# Patient Record
Sex: Female | Born: 1990 | Marital: Married | Smoking: Never smoker
Health system: Southern US, Community
[De-identification: ages and names within clinical notes are randomized; demographics above are authoritative.]

## PROBLEM LIST (undated history)

## (undated) DIAGNOSIS — Z8759 Personal history of other complications of pregnancy, childbirth and the puerperium: Secondary | ICD-10-CM

## (undated) DIAGNOSIS — IMO0001 Reserved for inherently not codable concepts without codable children: Secondary | ICD-10-CM

## (undated) DIAGNOSIS — H9190 Unspecified hearing loss, unspecified ear: Secondary | ICD-10-CM

## (undated) DIAGNOSIS — Z5189 Encounter for other specified aftercare: Secondary | ICD-10-CM

## (undated) HISTORY — DX: Encounter for other specified aftercare: Z51.89

## (undated) HISTORY — DX: Unspecified hearing loss, unspecified ear: H91.90

## (undated) HISTORY — PX: OTHER SURGICAL HISTORY: SHX169

## (undated) HISTORY — DX: Personal history of other complications of pregnancy, childbirth and the puerperium: Z87.59

## (undated) HISTORY — DX: Reserved for inherently not codable concepts without codable children: IMO0001

---

## 2016-11-10 DIAGNOSIS — H90A31 Mixed conductive and sensorineural hearing loss, unilateral, right ear with restricted hearing on the contralateral side: Secondary | ICD-10-CM | POA: Insufficient documentation

## 2016-11-10 DIAGNOSIS — H7201 Central perforation of tympanic membrane, right ear: Secondary | ICD-10-CM | POA: Insufficient documentation

## 2019-11-06 ENCOUNTER — Other Ambulatory Visit (HOSPITAL_COMMUNITY)
Admission: RE | Admit: 2019-11-06 | Discharge: 2019-11-06 | Disposition: A | Discharge status: Home / Self Care | Payer: Medicaid Other | Source: Ambulatory Visit | Attending: Advanced Practice Midwife | Admitting: Advanced Practice Midwife

## 2019-11-06 ENCOUNTER — Other Ambulatory Visit: Payer: Self-pay

## 2019-11-06 ENCOUNTER — Encounter: Payer: Self-pay | Admitting: Advanced Practice Midwife

## 2019-11-06 ENCOUNTER — Ambulatory Visit (INDEPENDENT_AMBULATORY_CARE_PROVIDER_SITE_OTHER): Payer: Medicaid Other | Admitting: Advanced Practice Midwife

## 2019-11-06 VITALS — BP 98/52 | HR 80 | Ht 62.0 in | Wt 148.0 lb

## 2019-11-06 DIAGNOSIS — O0990 Supervision of high risk pregnancy, unspecified, unspecified trimester: Secondary | ICD-10-CM

## 2019-11-06 DIAGNOSIS — Z3A23 23 weeks gestation of pregnancy: Secondary | ICD-10-CM | POA: Diagnosis present

## 2019-11-06 DIAGNOSIS — O0930 Supervision of pregnancy with insufficient antenatal care, unspecified trimester: Secondary | ICD-10-CM | POA: Insufficient documentation

## 2019-11-06 DIAGNOSIS — O0932 Supervision of pregnancy with insufficient antenatal care, second trimester: Secondary | ICD-10-CM

## 2019-11-06 DIAGNOSIS — Z789 Other specified health status: Secondary | ICD-10-CM

## 2019-11-06 DIAGNOSIS — Z3480 Encounter for supervision of other normal pregnancy, unspecified trimester: Secondary | ICD-10-CM

## 2019-11-06 NOTE — Patient Instructions (Signed)

## 2019-11-06 NOTE — Progress Notes (Signed)
Interpreter # [phone] Ipad. Armandina Stammer RN   Patient reports that her last period was Sept 2019 and receive a positive pregnancy test in July 2021.  Patient had been on Depo Provera.Patient reports that she has been feeling fetal movements since July 2021. Patient states her back has been hurting. Armandina Stammer RN   DATING AND VIABILITY SONOGRAM   Patricia Bonilla is a 29 y.o. year old G77P2002 with LMP No LMP recorded (lmp unknown). Patient is pregnant. which would correlate to  [redacted]w[redacted]d weeks gestation.  She has irregular menstrual cycles and was previously on Depo Provera and reports her LMP was in Sept 2019  She is here today for a confirmatory initial sonogram.    GESTATION: [place]   FETAL ACTIVITY:          Heart rate    147             The fetus is active.     GESTATIONAL AGE AND  BIOMETRICS:  Gestational criteria: Estimated Date of Delivery: 03/05/20 by todays ultrasound now at 23w  Previous Scans:0           Femur lenth 3.99 cm 22.6 weeks  BPD 7.43 cm 29.0 weeks  HC 21.29 cm 23.2 weeks                                                                   AVERAGE EGA(BY THIS SCAN):  23.0 weeks  WORKING EDD(  ultrasound ):  03/05/2020     TECHNICIAN COMMENTS: Patient scheduled for anatomy ultrasound with MFM on August 26th.  Patient informed that the ultrasound is considered a limited obstetric ultrasound and is not intended to be a complete ultrasound exam. Patient also informed that the ultrasound is not being completed with the intent of assessing for fetal or placental anomalies or any pelvic abnormalities. Explained that the purpose of today's ultrasound is to assess for fetal heart rate. Patient acknowledges the purpose of the exam and the limitations of the study.     Armandina Stammer 11/06/2019 11:32 AM  Patient was assessed and managed by nursing staff during this encounter. I have reviewed the chart and agree with the documentation and plan. I have  also made any necessary editorial changes.  Wynelle Bourgeois, CNM 11/06/2019 12:17 PM

## 2019-11-06 NOTE — Progress Notes (Signed)
PRENATAL VISIT NOTE  Subjective:  Patricia Bonilla is a 29 y.o. G3P2002 at [redacted]w[redacted]d being seen today for her first prenatal visit for this pregnancy.  She is currently monitored for the following issues for this low-risk pregnancy and has Supervision of other normal pregnancy, antepartum; Late prenatal care affecting pregnancy; Language barrier; Mixed conductive and sensorineural hearing loss of right ear with restricted hearing of left ear; and Tympanic membrane central perforation, right on their problem list.  Late to care due to unsure dates.  States had Neg UPT in ED in April   Had been on DepoProvera [place] interpretor used (IPAD)  Patient reports no complaints.  Contractions: Not present. Vag. Bleeding: None.  Movement: Present. Denies leaking of fluid.   She is planning to breastfeed. Desires Paragard IUD for contraception.   The following portions of the patient's history were reviewed and updated as appropriate: allergies, current medications, past family history, past medical history, past social history, past surgical history and problem list.   Objective:   Vitals:   11/06/19 1032 11/06/19 1032  BP: (!) 98/52   Pulse: 80   Weight: 148 lb (67.1 kg)   Height:  5\' 2"  (1.575 m)    Fetal Status: Fetal Heart Rate (bpm): 147 Fundal Height: 23 cm Movement: Present     General:  Alert, oriented and cooperative. Patient is in no acute distress.  Skin: Skin is warm and dry. No rash noted.   Cardiovascular: Normal heart rate and rhythm noted  Respiratory: Normal respiratory effort, no problems with respiration noted. Clear to auscultation.   Abdomen: Soft, gravid, appropriate for gestational age. Normal bowel sounds. Non-tender. Pain/Pressure: Present     Pelvic: Cervical exam performed       Normal cervical contour, no lesions, no bleeding following pap, normal discharge  Extremities: Normal range of motion.  Edema: None  Mental Status: Normal mood and affect. Normal behavior. Normal  judgment and thought content.   Assessment and Plan:  Pregnancy: G3P2002 at [redacted]w[redacted]d  Initial labs drawn. Continue prenatal vitamins. Discussed and offered genetic screening options, including Quad screen/AFP, NIPS testing, and option to decline testing. Benefits/risks/alternatives reviewed. Pt aware that anatomy [redacted]w[redacted]d is form of genetic screening with lower accuracy in detecting trisomies than blood work.  Pt chooses genetic screening today. NIPS: ordered. Ultrasound discussed; fetal anatomic survey: ordered. Problem list reviewed and updated. The nature of [place] [facility] with multiple MDs and other Advanced Practice Providers was explained to patient; also emphasized that residents, students are part of our team. Routine obstetric precautions reviewed.  1. [redacted] weeks gestation of pregnancy  - Cytology - PAP( [place]) - AFP TETRA - [facility] MFM OB DETAIL +14 WK; Future - CBC/D/Plt+RPR+Rh+ABO+Rub Ab... - Hemoglobin A1c - Hemoglobpathy+Fer w/A Thal Rfx - Urine Culture  2. Late prenatal care affecting pregnancy, antepartum  - Cytology - PAP( [place]) - AFP TETRA - [place] MFM OB DETAIL +14 WK; Future - CBC/D/Plt+RPR+Rh+ABO+Rub Ab... - Hemoglobin A1c - Hemoglobpathy+Fer w/A Thal Rfx - Urine Culture  3. Supervision of high risk pregnancy, antepartum  - Cytology - PAP( ) - AFP TETRA - [place] MFM OB DETAIL +14 WK; Future - CBC/D/Plt+RPR+Rh+ABO+Rub Ab... - Hemoglobin A1c - Hemoglobpathy+Fer w/A Thal Rfx - Urine Culture  4. Supervision of other normal pregnancy, antepartum   5. Late prenatal care affecting pregnancy in second trimester     Panorama today     Anatomy US this week  6. Language barrier  IPAD interpretor used  Preterm labor symptoms and general obstetric precautions including but not limited to vaginal bleeding, contractions, leaking of fluid and fetal movement were reviewed in detail with the patient. Please refer to After  Visit Summary for other counseling recommendations.   Return in about 4 weeks (around 12/04/2019) for [facility].  Future Appointments  Date Time Provider Department Center  11/08/2019 12:30 PM [facility] NURSE [facility]  11/08/2019 12:45 PM [facility] US6 [facility]  12/04/2019  9:30 AM Aviva Signs, CNM [facility] None    Wynelle Bourgeois, CNM

## 2019-11-07 LAB — CYTOLOGY - PAP
Chlamydia: NEGATIVE
Comment: NEGATIVE
Comment: NEGATIVE
Comment: NORMAL
Diagnosis: NEGATIVE
High risk HPV: NEGATIVE
Neisseria Gonorrhea: NEGATIVE

## 2019-11-08 ENCOUNTER — Ambulatory Visit: Payer: Medicaid Other | Admitting: *Deleted

## 2019-11-08 ENCOUNTER — Other Ambulatory Visit: Payer: Self-pay | Admitting: *Deleted

## 2019-11-08 ENCOUNTER — Other Ambulatory Visit: Payer: Self-pay

## 2019-11-08 ENCOUNTER — Encounter: Payer: Self-pay | Admitting: *Deleted

## 2019-11-08 ENCOUNTER — Ambulatory Visit: Payer: Medicaid Other | Attending: Advanced Practice Midwife

## 2019-11-08 DIAGNOSIS — Z3A23 23 weeks gestation of pregnancy: Secondary | ICD-10-CM | POA: Diagnosis not present

## 2019-11-08 DIAGNOSIS — Z3687 Encounter for antenatal screening for uncertain dates: Secondary | ICD-10-CM | POA: Diagnosis not present

## 2019-11-08 DIAGNOSIS — Z362 Encounter for other antenatal screening follow-up: Secondary | ICD-10-CM

## 2019-11-08 DIAGNOSIS — O0932 Supervision of pregnancy with insufficient antenatal care, second trimester: Secondary | ICD-10-CM | POA: Insufficient documentation

## 2019-11-08 DIAGNOSIS — Z3480 Encounter for supervision of other normal pregnancy, unspecified trimester: Secondary | ICD-10-CM

## 2019-11-08 DIAGNOSIS — O2692 Pregnancy related conditions, unspecified, second trimester: Secondary | ICD-10-CM

## 2019-11-08 DIAGNOSIS — Z789 Other specified health status: Secondary | ICD-10-CM

## 2019-11-08 DIAGNOSIS — O0990 Supervision of high risk pregnancy, unspecified, unspecified trimester: Secondary | ICD-10-CM

## 2019-11-08 DIAGNOSIS — O0930 Supervision of pregnancy with insufficient antenatal care, unspecified trimester: Secondary | ICD-10-CM

## 2019-11-08 DIAGNOSIS — O0992 Supervision of high risk pregnancy, unspecified, second trimester: Secondary | ICD-10-CM | POA: Insufficient documentation

## 2019-11-08 LAB — URINE CULTURE

## 2019-11-13 LAB — AFP TETRA
DIA Value (EIA): 242.74 pg/mL
Gestational Age: 22.6 WEEKS
MSAFP: 141.3 ng/mL
MSHCG: 44421 m[IU]/mL
Maternal Age At EDD: 31.5 yr
uE3 Value: 4.11 ng/mL

## 2019-11-13 LAB — HCV INTERPRETATION

## 2019-11-13 LAB — CBC/D/PLT+RPR+RH+ABO+RUB AB...
Antibody Screen: NEGATIVE
Basophils Absolute: 0 10*3/uL (ref 0.0–0.2)
Basos: 0 %
EOS (ABSOLUTE): 0.1 10*3/uL (ref 0.0–0.4)
Eos: 2 %
HCV Ab: <0.1 s/co ratio (ref 0.0–0.9)
HIV Screen 4th Generation wRfx: NONREACTIVE
Hematocrit: 30.9 % — ABNORMAL LOW (ref 34.0–46.6)
Hemoglobin: 10.5 g/dL — ABNORMAL LOW (ref 11.1–15.9)
Hepatitis B Surface Ag: NEGATIVE
Immature Grans (Abs): 0.1 10*3/uL (ref 0.0–0.1)
Immature Granulocytes: 1 %
Lymphocytes Absolute: 1.5 10*3/uL (ref 0.7–3.1)
Lymphs: 18 %
MCH: 32.1 pg (ref 26.6–33.0)
MCHC: 34 g/dL (ref 31.5–35.7)
MCV: 95 fL (ref 79–97)
Monocytes Absolute: 0.4 10*3/uL (ref 0.1–0.9)
Monocytes: 5 %
Neutrophils Absolute: 6.1 10*3/uL (ref 1.4–7.0)
Neutrophils: 74 %
Platelets: 245 10*3/uL (ref 150–450)
RBC: 3.27 x10E6/uL — ABNORMAL LOW (ref 3.77–5.28)
RDW: 12.7 % (ref 11.7–15.4)
RPR Ser Ql: NONREACTIVE
Rh Factor: POSITIVE
Rubella Antibodies, IGG: 3.74 index (ref >0.99)
WBC: 8.2 10*3/uL (ref 3.4–10.8)

## 2019-11-13 LAB — HEMOGLOBPATHY+FER W/A THAL RFX
Ferritin: 19 ng/mL (ref 15–150)
Hgb A2: 2.7 % (ref 1.8–3.2)
Hgb A: 96.7 % (ref 96.4–98.8)
Hgb F: 0.6 % (ref 0.0–2.0)
Hgb S: 0 %

## 2019-11-13 LAB — HEMOGLOBIN A1C
Est. average glucose Bld gHb Est-mCnc: 88 mg/dL
Hgb A1c MFr Bld: 4.7 % — ABNORMAL LOW (ref 4.8–5.6)

## 2019-12-04 ENCOUNTER — Encounter: Payer: Self-pay | Admitting: Advanced Practice Midwife

## 2019-12-04 ENCOUNTER — Ambulatory Visit (INDEPENDENT_AMBULATORY_CARE_PROVIDER_SITE_OTHER): Payer: Medicaid Other | Admitting: Advanced Practice Midwife

## 2019-12-04 ENCOUNTER — Other Ambulatory Visit: Payer: Self-pay

## 2019-12-04 VITALS — BP 90/45 | HR 71 | Wt 150.0 lb

## 2019-12-04 DIAGNOSIS — K5900 Constipation, unspecified: Secondary | ICD-10-CM

## 2019-12-04 DIAGNOSIS — Z3A26 26 weeks gestation of pregnancy: Secondary | ICD-10-CM

## 2019-12-04 DIAGNOSIS — Z789 Other specified health status: Secondary | ICD-10-CM

## 2019-12-04 DIAGNOSIS — O0930 Supervision of pregnancy with insufficient antenatal care, unspecified trimester: Secondary | ICD-10-CM

## 2019-12-04 MED ORDER — FIBERCON 625 MG PO TABS: 625.0000 mg | ORAL_TABLET | Freq: Every day | ORAL | 12 refills | Status: DC

## 2019-12-04 MED ORDER — POLYETHYLENE GLYCOL 3350 17 GM/SCOOP PO POWD: 1.0000 | Freq: Once | ORAL | Status: DC

## 2019-12-04 MED ORDER — PRENATAL VITAMIN 27-0.8 MG PO TABS: 1.0000 | ORAL_TABLET | Freq: Every day | ORAL | 12 refills | Status: AC

## 2019-12-04 NOTE — Progress Notes (Signed)
   PRENATAL VISIT NOTE  Subjective:  Patricia Bonilla is a 29 y.o. G3P2002 at [redacted]w[redacted]d being seen today for ongoing prenatal care.  She is currently monitored for the following issues for this low-risk pregnancy and has Supervision of other normal pregnancy, antepartum; Late prenatal care affecting pregnancy; Language barrier; Mixed conductive and sensorineural hearing loss of right ear with restricted hearing of left ear; and Tympanic membrane central perforation, right on their problem list.  Patient reports no complaints.  Contractions: Not present. Vag. Bleeding: None.  Movement: Present. Denies leaking of fluid.   The following portions of the patient's history were reviewed and updated as appropriate: allergies, current medications, past family history, past medical history, past social history, past surgical history and problem list.   Objective:   Vitals:   12/04/19 0940  BP: (!) 90/45  Pulse: 71  Weight: 150 lb (68 kg)    Fetal Status: Fetal Heart Rate (bpm): 142   Movement: Present     General:  Alert, oriented and cooperative. Patient is in no acute distress.  Skin: Skin is warm and dry. No rash noted.   Cardiovascular: Normal heart rate noted  Respiratory: Normal respiratory effort, no problems with respiration noted  Abdomen: Soft, gravid, appropriate for gestational age.  Pain/Pressure: Absent     Pelvic: Cervical exam deferred        Extremities: Normal range of motion.  Edema: None  Mental Status: Normal mood and affect. Normal behavior. Normal judgment and thought content.   Assessment and Plan:  Pregnancy: G3P2002 at [redacted]w[redacted]d 1. Late prenatal care affecting pregnancy, antepartum     Some round ligament pains, reviewed difference between this and PTL     C/O some constipation,  Will prescribe Miralax for PRN use and daily fiber     Rx PNV per request  2. Language barrier   In person interpretor today  3. [redacted] weeks gestation of pregnancy    Glucola  Preterm labor  symptoms and general obstetric precautions including but not limited to vaginal bleeding, contractions, leaking of fluid and fetal movement were reviewed in detail with the patient. Please refer to After Visit Summary for other counseling recommendations.   Return in about 2 weeks (around 12/18/2019) for [employer] [facility].  Future Appointments  Date Time Provider Department Center  12/18/2019  8:10 AM Aviva Signs, CNM [facility] None  12/28/2019 10:45 AM [facility] US4 [facility]    Wynelle Bourgeois, CNM

## 2019-12-04 NOTE — Patient Instructions (Signed)

## 2019-12-18 ENCOUNTER — Other Ambulatory Visit: Payer: Self-pay

## 2019-12-18 ENCOUNTER — Encounter: Payer: Self-pay | Admitting: Advanced Practice Midwife

## 2019-12-18 ENCOUNTER — Ambulatory Visit (INDEPENDENT_AMBULATORY_CARE_PROVIDER_SITE_OTHER): Payer: Medicaid Other | Admitting: Advanced Practice Midwife

## 2019-12-18 VITALS — BP 100/53 | HR 83

## 2019-12-18 DIAGNOSIS — O0930 Supervision of pregnancy with insufficient antenatal care, unspecified trimester: Secondary | ICD-10-CM

## 2019-12-18 DIAGNOSIS — Z603 Acculturation difficulty: Secondary | ICD-10-CM

## 2019-12-18 DIAGNOSIS — Z3A28 28 weeks gestation of pregnancy: Secondary | ICD-10-CM

## 2019-12-18 DIAGNOSIS — K5900 Constipation, unspecified: Secondary | ICD-10-CM

## 2019-12-18 DIAGNOSIS — Z789 Other specified health status: Secondary | ICD-10-CM

## 2019-12-18 NOTE — Progress Notes (Signed)
   PRENATAL VISIT NOTE  Subjective:  Patricia Bonilla is a 29 y.o. G3P2002 at [redacted]w[redacted]d being seen today for ongoing prenatal care.  She is currently monitored for the following issues for this low-risk pregnancy and has Supervision of other normal pregnancy, antepartum; Late prenatal care affecting pregnancy; Language barrier; Mixed conductive and sensorineural hearing loss of right ear with restricted hearing of left ear; and Tympanic membrane central perforation, right on their problem list.  Patient reports no complaints. Constipation has improved. Contractions: Not present. Vag. Bleeding: None.  Movement: Present. Denies leaking of fluid.   The following portions of the patient's history were reviewed and updated as appropriate: allergies, current medications, past family history, past medical history, past social history, past surgical history and problem list.   Objective:   Vitals:   12/18/19 0817  BP: (!) 100/53  Pulse: 83    Fetal Status: Fetal Heart Rate (bpm): 140   Movement: Present     General:  Alert, oriented and cooperative. Patient is in no acute distress.  Skin: Skin is warm and dry. No rash noted.   Cardiovascular: Normal heart rate noted  Respiratory: Normal respiratory effort, no problems with respiration noted  Abdomen: Soft, gravid, appropriate for gestational age.  Pain/Pressure: Absent     Pelvic: Cervical exam deferred        Extremities: Normal range of motion.  Edema: None  Mental Status: Normal mood and affect. Normal behavior. Normal judgment and thought content.   Assessment and Plan:  Pregnancy: G3P2002 at [redacted]w[redacted]d 1. Late prenatal care affecting pregnancy, antepartum      Doing well.  Baby moving daily  2. Language barrier     In person interpretor  3. Constipation, unspecified constipation type     Improved without meds  4. [redacted] weeks gestation of pregnancy  - Glucose Tolerance, 2 Hours w/1 Hour - RPR - HIV antibody (with reflex) - CBC  Preterm labor  symptoms and general obstetric precautions including but not limited to vaginal bleeding, contractions, leaking of fluid and fetal movement were reviewed in detail with the patient. Please refer to After Visit Summary for other counseling recommendations.   Return in about 2 weeks (around 01/01/2020) for [employer].  Future Appointments  Date Time Provider Department Center  12/18/2019  8:40 AM Aviva Signs, CNM [facility] None  12/28/2019 10:45 AM [facility] US4 [facility]    Wynelle Bourgeois, CNM

## 2019-12-18 NOTE — Patient Instructions (Signed)
Third Trimester of Pregnancy  The third trimester is from week 28 through week 40 (months 7 through 9). This trimester is when your unborn baby (fetus) is growing very fast. At the end of the ninth month, the unborn baby is about 20 inches in length. It weighs about 6-10 pounds. Follow these instructions at home: Medicines  Take over-the-counter and prescription medicines only as told by your doctor. Some medicines are safe and some medicines are not safe during pregnancy.  Take a prenatal vitamin that contains at least 600 micrograms (mcg) of folic acid.  If you have trouble pooping (constipation), take medicine that will make your stool soft (stool softener) if your doctor approves. Eating and drinking   Eat regular, healthy meals.  Avoid raw meat and uncooked cheese.  If you get low calcium from the food you eat, talk to your doctor about taking a daily calcium supplement.  Eat four or five small meals rather than three large meals a day.  Avoid foods that are high in fat and sugars, such as fried and sweet foods.  To prevent constipation: ? Eat foods that are high in fiber, like fresh fruits and vegetables, whole grains, and beans. ? Drink enough fluids to keep your pee (urine) clear or pale yellow. Activity  Exercise only as told by your doctor. Stop exercising if you start to have cramps.  Avoid heavy lifting, wear low heels, and sit up straight.  Do not exercise if it is too hot, too humid, or if you are in a place of great height (high altitude).  You may continue to have sex unless your doctor tells you not to. Relieving pain and discomfort  Wear a good support bra if your breasts are tender.  Take frequent breaks and rest with your legs raised if you have leg cramps or low back pain.  Take warm water baths (sitz baths) to soothe pain or discomfort caused by hemorrhoids. Use hemorrhoid cream if your doctor approves.  If you develop puffy, bulging veins (varicose  veins) in your legs: ? Wear support hose or compression stockings as told by your doctor. ? Raise (elevate) your feet for 15 minutes, 3-4 times a day. ? Limit salt in your food. Safety  Wear your seat belt when driving.  Make a list of emergency phone numbers, including numbers for family, friends, the hospital, and police and fire departments. Preparing for your baby's arrival To prepare for the arrival of your baby:  Take prenatal classes.  Practice driving to the hospital.  Visit the hospital and tour the maternity area.  Talk to your work about taking leave once the baby comes.  Pack your hospital bag.  Prepare the baby's room.  Go to your doctor visits.  Buy a rear-facing car seat. Learn how to install it in your car. General instructions  Do not use hot tubs, steam rooms, or saunas.  Do not use any products that contain nicotine or tobacco, such as cigarettes and e-cigarettes. If you need help quitting, ask your doctor.  Do not drink alcohol.  Do not douche or use tampons or scented sanitary pads.  Do not cross your legs for long periods of time.  Do not travel for long distances unless you must. Only do so if your doctor says it is okay.  Visit your dentist if you have not gone during your pregnancy. Use a soft toothbrush to brush your teeth. Be gentle when you floss.  Avoid cat litter boxes and soil   used by cats. These carry germs that can cause birth defects in the baby and can cause a loss of your baby (miscarriage) or stillbirth.  Keep all your prenatal visits as told by your doctor. This is important. Contact a doctor if:  You are not sure if you are in labor or if your water has broken.  You are dizzy.  You have mild cramps or pressure in your lower belly.  You have a nagging pain in your belly area.  You continue to feel sick to your stomach, you throw up, or you have watery poop.  You have bad smelling fluid coming from your vagina.  You have  pain when you pee. Get help right away if:  You have a fever.  You are leaking fluid from your vagina.  You are spotting or bleeding from your vagina.  You have severe belly cramps or pain.  You lose or gain weight quickly.  You have trouble catching your breath and have chest pain.  You notice sudden or extreme puffiness (swelling) of your face, hands, ankles, feet, or legs.  You have not felt the baby move in over an hour.  You have severe headaches that do not go away with medicine.  You have trouble seeing.  You are leaking, or you are having a gush of fluid, from your vagina before you are 37 weeks.  You have regular belly spasms (contractions) before you are 37 weeks. Summary  The third trimester is from week 28 through week 40 (months 7 through 9). This time is when your unborn baby is growing very fast.  Follow your doctor's advice about medicine, food, and activity.  Get ready for the arrival of your baby by taking prenatal classes, getting all the baby items ready, preparing the baby's room, and visiting your doctor to be checked.  Get help right away if you are bleeding from your vagina, or you have chest pain and trouble catching your breath, or if you have not felt your baby move in over an hour. This information is not intended to replace advice given to you by your health care provider. Make sure you discuss any questions you have with your health care provider. Document Revised: 06/22/2018 Document Reviewed: 04/06/2016 Elsevier Patient Education  2020 [employer] Glucose Tolerance Test During Pregnancy Why am I having this test? The glucose tolerance test (GTT) is done to check how your body processes sugar (glucose). This is one of several tests used to diagnose diabetes that develops during pregnancy (gestational diabetes mellitus). Gestational diabetes is a temporary form of diabetes that some women develop during pregnancy. It usually occurs during the  second trimester of pregnancy and goes away after delivery. Testing (screening) for gestational diabetes usually occurs between 24 and 28 weeks of pregnancy. You may have the GTT test after having a 1-hour glucose screening test if the results from that test indicate that you may have gestational diabetes. You may also have this test if:  You have a history of gestational diabetes.  You have a history of giving birth to very large babies or have experienced repeated fetal loss (stillbirth).  You have signs and symptoms of diabetes, such as: ? Changes in your vision. ? Tingling or numbness in your hands or feet. ? Changes in hunger, thirst, and urination that are not otherwise explained by your pregnancy. What is being tested? This test measures the amount of glucose in your blood at different times during a period of 3   hours. This indicates how well your body is able to process glucose. What kind of sample is taken?  Blood samples are required for this test. They are usually collected by inserting a needle into a blood vessel. How do I prepare for this test?  For 3 days before your test, eat normally. Have plenty of carbohydrate-rich foods.  Follow instructions from your health care provider about: ? Eating or drinking restrictions on the day of the test. You may be asked to not eat or drink anything other than water (fast) starting 8-10 hours before the test. ? Changing or stopping your regular medicines. Some medicines may interfere with this test. Tell a health care provider about:  All medicines you are taking, including vitamins, herbs, eye drops, creams, and over-the-counter medicines.  Any blood disorders you have.  Any surgeries you have had.  Any medical conditions you have. What happens during the test? First, your blood glucose will be measured. This is referred to as your fasting blood glucose, since you fasted before the test. Then, you will drink a glucose solution that  contains a certain amount of glucose. Your blood glucose will be measured again 1, 2, and 3 hours after drinking the solution. This test takes about 3 hours to complete. You will need to stay at the testing location during this time. During the testing period:  Do not eat or drink anything other than the glucose solution.  Do not exercise.  Do not use any products that contain nicotine or tobacco, such as cigarettes and e-cigarettes. If you need help stopping, ask your health care provider. The testing procedure may vary among health care providers and hospitals. How are the results reported? Your results will be reported as milligrams of glucose per deciliter of blood (mg/dL) or millimoles per liter (mmol/L). Your health care provider will compare your results to normal ranges that were established after testing a large group of people (reference ranges). Reference ranges may vary among labs and hospitals. For this test, common reference ranges are:  Fasting: less than 95-105 mg/dL (5.3-5.8 mmol/L).  1 hour after drinking glucose: less than 180-190 mg/dL (10.0-10.5 mmol/L).  2 hours after drinking glucose: less than 155-165 mg/dL (8.6-9.2 mmol/L).  3 hours after drinking glucose: 140-145 mg/dL (7.8-8.1 mmol/L). What do the results mean? Results within reference ranges are considered normal, meaning that your glucose levels are well-controlled. If two or more of your blood glucose levels are high, you may be diagnosed with gestational diabetes. If only one level is high, your health care provider may suggest repeat testing or other tests to confirm a diagnosis. Talk with your health care provider about what your results mean. Questions to ask your health care provider Ask your health care provider, or the department that is doing the test:  When will my results be ready?  How will I get my results?  What are my treatment options?  What other tests do I need?  What are my next  steps? Summary  The glucose tolerance test (GTT) is one of several tests used to diagnose diabetes that develops during pregnancy (gestational diabetes mellitus). Gestational diabetes is a temporary form of diabetes that some women develop during pregnancy.  You may have the GTT test after having a 1-hour glucose screening test if the results from that test indicate that you may have gestational diabetes. You may also have this test if you have any symptoms or risk factors for gestational diabetes.  Talk with your health   care provider about what your results mean. This information is not intended to replace advice given to you by your health care provider. Make sure you discuss any questions you have with your health care provider. Document Revised: 06/22/2018 Document Reviewed: 10/11/2016 Elsevier Patient Education  2020 [employer]vier Inc[employer]  

## 2019-12-19 ENCOUNTER — Encounter: Payer: Self-pay | Admitting: Advanced Practice Midwife

## 2019-12-19 ENCOUNTER — Other Ambulatory Visit: Payer: Self-pay | Admitting: Advanced Practice Midwife

## 2019-12-19 DIAGNOSIS — D649 Anemia, unspecified: Secondary | ICD-10-CM | POA: Insufficient documentation

## 2019-12-19 DIAGNOSIS — D509 Iron deficiency anemia, unspecified: Secondary | ICD-10-CM

## 2019-12-19 LAB — GLUCOSE TOLERANCE, 2 HOURS W/ 1HR
Glucose, 1 hour: 119 mg/dL (ref 65–179)
Glucose, 2 hour: 104 mg/dL (ref 65–152)
Glucose, Fasting: 72 mg/dL (ref 65–91)

## 2019-12-19 LAB — CBC
Hematocrit: 28.2 % — ABNORMAL LOW (ref 34.0–46.6)
Hemoglobin: 9.3 g/dL — ABNORMAL LOW (ref 11.1–15.9)
MCH: 31.1 pg (ref 26.6–33.0)
MCHC: 33 g/dL (ref 31.5–35.7)
MCV: 94 fL (ref 79–97)
Platelets: 236 10*3/uL (ref 150–450)
RBC: 2.99 x10E6/uL — ABNORMAL LOW (ref 3.77–5.28)
RDW: 12 % (ref 11.7–15.4)
WBC: 8.3 10*3/uL (ref 3.4–10.8)

## 2019-12-19 LAB — RPR: RPR Ser Ql: NONREACTIVE

## 2019-12-19 LAB — HIV ANTIBODY (ROUTINE TESTING W REFLEX): HIV Screen 4th Generation wRfx: NONREACTIVE

## 2019-12-19 MED ORDER — HEMOCYTE-F 324-1 MG PO TABS: 1.0000 | ORAL_TABLET | Freq: Two times a day (BID) | ORAL | 2 refills | Status: DC

## 2019-12-19 NOTE — Progress Notes (Signed)
Anemia Rx Hemocyte F

## 2019-12-28 ENCOUNTER — Other Ambulatory Visit: Payer: Self-pay

## 2019-12-28 ENCOUNTER — Ambulatory Visit: Payer: Medicaid Other | Attending: Obstetrics and Gynecology

## 2019-12-28 DIAGNOSIS — O2693 Pregnancy related conditions, unspecified, third trimester: Secondary | ICD-10-CM

## 2019-12-28 DIAGNOSIS — Z3A30 30 weeks gestation of pregnancy: Secondary | ICD-10-CM

## 2019-12-28 DIAGNOSIS — O0933 Supervision of pregnancy with insufficient antenatal care, third trimester: Secondary | ICD-10-CM

## 2019-12-28 DIAGNOSIS — Z362 Encounter for other antenatal screening follow-up: Secondary | ICD-10-CM

## 2019-12-28 DIAGNOSIS — O321XX0 Maternal care for breech presentation, not applicable or unspecified: Secondary | ICD-10-CM

## 2020-01-01 ENCOUNTER — Ambulatory Visit (INDEPENDENT_AMBULATORY_CARE_PROVIDER_SITE_OTHER): Payer: Medicaid Other | Admitting: Advanced Practice Midwife

## 2020-01-01 ENCOUNTER — Other Ambulatory Visit: Payer: Self-pay

## 2020-01-01 ENCOUNTER — Encounter: Payer: Self-pay | Admitting: Advanced Practice Midwife

## 2020-01-01 VITALS — BP 95/56 | HR 80 | Wt 153.0 lb

## 2020-01-01 DIAGNOSIS — R040 Epistaxis: Secondary | ICD-10-CM

## 2020-01-01 DIAGNOSIS — O0930 Supervision of pregnancy with insufficient antenatal care, unspecified trimester: Secondary | ICD-10-CM

## 2020-01-01 DIAGNOSIS — Z3A30 30 weeks gestation of pregnancy: Secondary | ICD-10-CM

## 2020-01-01 DIAGNOSIS — D509 Iron deficiency anemia, unspecified: Secondary | ICD-10-CM

## 2020-01-01 NOTE — Progress Notes (Signed)
Video interpreter used for visit [phone]. Armandina Stammer RN

## 2020-01-01 NOTE — Patient Instructions (Signed)

## 2020-01-01 NOTE — Progress Notes (Signed)
   PRENATAL VISIT NOTE  Subjective:  Patricia Bonilla is a 29 y.o. G3P2002 at [redacted]w[redacted]d being seen today for ongoing prenatal care.  She is currently monitored for the following issues for this low-risk pregnancy and has Supervision of other normal pregnancy, antepartum; Late prenatal care affecting pregnancy; Language barrier; Mixed conductive and sensorineural hearing loss of right ear with restricted hearing of left ear; Tympanic membrane central perforation, right; and Anemia on their problem list.  Patient reports has congestion only at night, sometimes when she blows her nose, it bleeds.  Occ cough only at night.  Contractions: Not present. Vag. Bleeding: None.  Movement: Present. Denies leaking of fluid.   The following portions of the patient's history were reviewed and updated as appropriate: allergies, current medications, past family history, past medical history, past social history, past surgical history and problem list.   Objective:   Vitals:   01/01/20 1037  BP: (!) 95/56  Pulse: 80  Weight: 153 lb (69.4 kg)    Fetal Status: Fetal Heart Rate (bpm): 140 Fundal Height: 31 cm Movement: Present     General:  Alert, oriented and cooperative. Patient is in no acute distress.  Skin: Skin is warm and dry. No rash noted.   Cardiovascular: Normal heart rate noted  Respiratory: Normal respiratory effort, no problems with respiration noted  Abdomen: Soft, gravid, appropriate for gestational age.  Pain/Pressure: Absent     Pelvic: Cervical exam deferred        Extremities: Normal range of motion.  Edema: None  Mental Status: Normal mood and affect. Normal behavior. Normal judgment and thought content.   Assessment and Plan:  Pregnancy: G3P2002 at [redacted]w[redacted]d 1. [redacted] weeks gestation of pregnancy Reviewed glucola normal   2. Nosebleed, symptom Discussed it can be normal in pregnancy, let us know if prolonged or more frequent  3. Late prenatal care affecting pregnancy, antepartum   4. Iron  deficiency anemia, unspecified iron deficiency anemia type Started Hemocyte F for anemia Hgb 9.4  Preterm labor symptoms and general obstetric precautions including but not limited to vaginal bleeding, contractions, leaking of fluid and fetal movement were reviewed in detail with the patient. Please refer to After Visit Summary for other counseling recommendations.   Return in about 2 weeks (around 01/15/2020) for [facility].  Future Appointments  Date Time Provider Department Center  01/16/2020  8:45 AM Conan Bowens, MD [facility] None    Wynelle Bourgeois, CNM

## 2020-01-16 ENCOUNTER — Encounter: Payer: Self-pay | Admitting: Obstetrics and Gynecology

## 2020-01-16 ENCOUNTER — Other Ambulatory Visit: Payer: Self-pay

## 2020-01-16 ENCOUNTER — Ambulatory Visit (INDEPENDENT_AMBULATORY_CARE_PROVIDER_SITE_OTHER): Payer: Medicaid Other | Admitting: Obstetrics and Gynecology

## 2020-01-16 VITALS — BP 93/59 | HR 80 | Wt 152.0 lb

## 2020-01-16 DIAGNOSIS — Z3480 Encounter for supervision of other normal pregnancy, unspecified trimester: Secondary | ICD-10-CM

## 2020-01-16 DIAGNOSIS — O0933 Supervision of pregnancy with insufficient antenatal care, third trimester: Secondary | ICD-10-CM

## 2020-01-16 DIAGNOSIS — Z789 Other specified health status: Secondary | ICD-10-CM

## 2020-01-16 NOTE — Progress Notes (Signed)
   PRENATAL VISIT NOTE  Subjective:  Patricia Bonilla is a 29 y.o. G3P2002 at [redacted]w[redacted]d being seen today for ongoing prenatal care.  She is currently monitored for the following issues for this low-risk pregnancy and has Supervision of other normal pregnancy, antepartum; Late prenatal care affecting pregnancy; Language barrier; Mixed conductive and sensorineural hearing loss of right ear with restricted hearing of left ear; Tympanic membrane central perforation, right; and Anemia on their problem list.  Patient reports no complaints.  Contractions: Not present. Vag. Bleeding: None.  Movement: Present. Denies leaking of fluid.   The following portions of the patient's history were reviewed and updated as appropriate: allergies, current medications, past family history, past medical history, past social history, past surgical history and problem list.   Objective:   Vitals:   01/16/20 0853  BP: (!) 93/59  Pulse: 80  Weight: 152 lb (68.9 kg)    Fetal Status: Fetal Heart Rate (bpm): 140   Movement: Present     General:  Alert, oriented and cooperative. Patient is in no acute distress.  Skin: Skin is warm and dry. No rash noted.   Cardiovascular: Normal heart rate noted  Respiratory: Normal respiratory effort, no problems with respiration noted  Abdomen: Soft, gravid, appropriate for gestational age.  Pain/Pressure: Present     Pelvic: Cervical exam deferred        Extremities: Normal range of motion.  Edema: None  Mental Status: Normal mood and affect. Normal behavior. Normal judgment and thought content.   Assessment and Plan:  Pregnancy: G3P2002 at [redacted]w[redacted]d  1. Supervision of other normal pregnancy, antepartum Reviewed IUD for contraception, she is considering paraguard  2. Language barrier Publishing copy used  3. Late prenatal care affecting pregnancy in third trimester   Preterm labor symptoms and general obstetric precautions including but not limited to vaginal bleeding,  contractions, leaking of fluid and fetal movement were reviewed in detail with the patient. Please refer to After Visit Summary for other counseling recommendations.   Return in about 2 weeks (around 01/30/2020) for low OB, in person.  Future Appointments  Date Time Provider Department Center  01/28/2020  9:45 AM Adam Phenix, MD [facility] None    Conan Bowens, MD

## 2020-01-28 ENCOUNTER — Encounter: Payer: Medicaid Other | Admitting: Obstetrics & Gynecology

## 2020-02-01 ENCOUNTER — Other Ambulatory Visit: Payer: Self-pay

## 2020-02-01 ENCOUNTER — Ambulatory Visit (INDEPENDENT_AMBULATORY_CARE_PROVIDER_SITE_OTHER): Payer: Medicaid Other | Admitting: Obstetrics & Gynecology

## 2020-02-01 ENCOUNTER — Encounter: Payer: Self-pay | Admitting: Obstetrics & Gynecology

## 2020-02-01 VITALS — BP 93/52 | HR 63 | Wt 155.0 lb

## 2020-02-01 DIAGNOSIS — O2686 Pruritic urticarial papules and plaques of pregnancy (PUPPP): Secondary | ICD-10-CM

## 2020-02-01 DIAGNOSIS — Z3A35 35 weeks gestation of pregnancy: Secondary | ICD-10-CM

## 2020-02-01 DIAGNOSIS — Z3480 Encounter for supervision of other normal pregnancy, unspecified trimester: Secondary | ICD-10-CM

## 2020-02-01 MED ORDER — HEMOCYTE-F 324-1 MG PO TABS: 1.0000 | ORAL_TABLET | Freq: Two times a day (BID) | ORAL | 2 refills | Status: DC

## 2020-02-01 MED ORDER — HYDROCORTISONE 1 % EX CREA: 1.0000 "application " | TOPICAL_CREAM | Freq: Two times a day (BID) | CUTANEOUS | 0 refills | Status: DC

## 2020-02-01 NOTE — Patient Instructions (Signed)
Return to office for any scheduled appointments. Call the office or go to the MAU at [facility] [place] if:  You begin to have strong, frequent contractions  Your water breaks.  Sometimes it is a big gush of fluid, sometimes it is just a trickle that keeps getting your panties wet or running down your legs  You have vaginal bleeding.  It is normal to have a small amount of spotting if your cervix was checked.   You do not feel your baby moving like normal.  If you do not, get something to eat and drink and lay down and focus on feeling your baby move.   If your baby is still not moving like normal, you should call the office or go to MAU.  Any other obstetric concerns.   

## 2020-02-01 NOTE — Progress Notes (Signed)
Napali interpreter used [employer] Interpreter# [id number]. Armandina Stammer RN

## 2020-02-01 NOTE — Progress Notes (Signed)
   PRENATAL VISIT NOTE  Subjective:  Patricia Bonilla is a 29 y.o. G3P2002 at [redacted]w[redacted]d being seen today for ongoing prenatal care.  Nepali interpreter used. She is currently monitored for the following issues for this low-risk pregnancy and has Supervision of other normal pregnancy, antepartum; Late prenatal care affecting pregnancy; Language barrier; Mixed conductive and sensorineural hearing loss of right ear with restricted hearing of left ear; Tympanic membrane central perforation, right; and Anemia on their problem list.  Patient reports abdominal bumps and itching, wants medication for it. No itching any where else on body.  Contractions: Not present. Vag. Bleeding: None.  Movement: Present. Denies leaking of fluid.   The following portions of the patient's history were reviewed and updated as appropriate: allergies, current medications, past family history, past medical history, past social history, past surgical history and problem list.   Objective:   Vitals:   02/01/20 1009  BP: (!) 93/52  Pulse: 63  Weight: 155 lb (70.3 kg)    Fetal Status: Fetal Heart Rate (bpm): 145 Fundal Height: 35 cm Movement: Present     General:  Alert, oriented and cooperative. Patient is in no acute distress.  Skin: Skin is warm and dry. Papules noted on abdomen, some excoriation noted.  Cardiovascular: Normal heart rate noted  Respiratory: Normal respiratory effort, no problems with respiration noted  Abdomen: Soft, gravid, appropriate for gestational age.  Pain/Pressure: Present     Pelvic: Cervical exam deferred        Extremities: Normal range of motion.  Edema: None  Mental Status: Normal mood and affect. Normal behavior. Normal judgment and thought content.   Assessment and Plan:  Pregnancy: G3P2002 at [redacted]w[redacted]d 1. PUPP (pruritic urticarial papules and plaques of pregnancy) Hydrocortisone prescribed, will reevaluate next visit. - hydrocortisone cream 1 %; Apply 1 application topically 2 (two) times  daily. Apply to affected area  Dispense: 200 g; Refill: 0  2. [redacted] weeks gestation of pregnancy 3. Supervision of other normal pregnancy, antepartum Preterm labor symptoms and general obstetric precautions including but not limited to vaginal bleeding, contractions, leaking of fluid and fetal movement were reviewed in detail with the patient. Please refer to After Visit Summary for other counseling recommendations.   Return in about 2 weeks (around 02/15/2020) for OFFICE OB Visit, Pelvic cultures.  Future Appointments  Date Time Provider Department Center  02/12/2020 10:00 AM Aviva Signs, CNM [facility] None    Jaynie Collins, MD

## 2020-02-05 ENCOUNTER — Other Ambulatory Visit: Payer: Self-pay

## 2020-02-05 DIAGNOSIS — O2686 Pruritic urticarial papules and plaques of pregnancy (PUPPP): Secondary | ICD-10-CM

## 2020-02-05 MED ORDER — HYDROCORTISONE 1 % EX CREA: 1.0000 "application " | TOPICAL_CREAM | Freq: Two times a day (BID) | CUTANEOUS | 0 refills | Status: DC

## 2020-02-05 MED FILL — HYDROCORTISONE 1% CREAM: 1 | 7 days supply | Qty: 28 | Fill #0

## 2020-02-05 NOTE — Progress Notes (Signed)
Patient came with her brother to office to say that they have been having a hard time trying to pick up prescription for hydrocortisone cream at [facility]. Suggested trying outpatient pharmacy downstairs and resent medication there. Armandina Stammer RN

## 2020-02-12 ENCOUNTER — Ambulatory Visit (INDEPENDENT_AMBULATORY_CARE_PROVIDER_SITE_OTHER): Payer: Medicaid Other | Admitting: Advanced Practice Midwife

## 2020-02-12 ENCOUNTER — Other Ambulatory Visit: Payer: Self-pay

## 2020-02-12 ENCOUNTER — Encounter: Payer: Self-pay | Admitting: Advanced Practice Midwife

## 2020-02-12 ENCOUNTER — Other Ambulatory Visit (HOSPITAL_COMMUNITY)
Admission: RE | Admit: 2020-02-12 | Discharge: 2020-02-12 | Disposition: A | Discharge status: Home / Self Care | Payer: Medicaid Other | Source: Ambulatory Visit | Attending: Advanced Practice Midwife | Admitting: Advanced Practice Midwife

## 2020-02-12 VITALS — BP 97/54 | HR 86 | Wt 156.0 lb

## 2020-02-12 DIAGNOSIS — Z3A36 36 weeks gestation of pregnancy: Secondary | ICD-10-CM | POA: Insufficient documentation

## 2020-02-12 DIAGNOSIS — Z3490 Encounter for supervision of normal pregnancy, unspecified, unspecified trimester: Secondary | ICD-10-CM | POA: Diagnosis not present

## 2020-02-12 DIAGNOSIS — Z789 Other specified health status: Secondary | ICD-10-CM

## 2020-02-12 NOTE — Patient Instructions (Signed)

## 2020-02-12 NOTE — Progress Notes (Signed)
[place] [employer].

## 2020-02-12 NOTE — Progress Notes (Signed)
   PRENATAL VISIT NOTE  Subjective:  Patricia Bonilla is a 29 y.o. G3P2002 at [redacted]w[redacted]d being seen today for ongoing prenatal care.  She is currently monitored for the following issues for this low-risk pregnancy and has Supervision of other normal pregnancy, antepartum; Late prenatal care affecting pregnancy; Language barrier; Mixed conductive and sensorineural hearing loss of right ear with restricted hearing of left ear; Tympanic membrane central perforation, right; and Anemia on their problem list.  Patient reports no complaints.  Contractions: Irregular. Vag. Bleeding: None.  Movement: Present. Denies leaking of fluid.   The following portions of the patient's history were reviewed and updated as appropriate: allergies, current medications, past family history, past medical history, past social history, past surgical history and problem list.   Objective:   Vitals:   02/12/20 1033  BP: (!) 97/54  Pulse: 86  Weight: 156 lb (70.8 kg)    Fetal Status: Fetal Heart Rate (bpm): 142 Fundal Height: 36 cm Movement: Present  Presentation: Vertex  General:  Alert, oriented and cooperative. Patient is in no acute distress.  Skin: Skin is warm and dry. No rash noted.   Cardiovascular: Normal heart rate noted  Respiratory: Normal respiratory effort, no problems with respiration noted  Abdomen: Soft, gravid, appropriate for gestational age.  Pain/Pressure: Present     Pelvic: Cervical exam performed in the presence of a chaperone Dilation: Closed Effacement (%): 30 Station: Ballotable  Extremities: Normal range of motion.  Edema: None  Mental Status: Normal mood and affect. Normal behavior. Normal judgment and thought content.   Assessment and Plan:  Pregnancy: G3P2002 at [redacted]w[redacted]d 1. [redacted] weeks gestation of pregnancy      Vertex.  Cervix softening - GC/Chlamydia probe amp ([place])not at [facility] - Culture, beta strep (group b only)  2. Encounter for supervision of low-risk pregnancy, antepartum  -  GC/Chlamydia probe amp ([place])not at [facility] - Culture, beta strep (group b only)  Term labor symptoms and general obstetric precautions including but not limited to vaginal bleeding, contractions, leaking of fluid and fetal movement were reviewed in detail with the patient. Please refer to After Visit Summary for other counseling recommendations.   Return in about 1 week (around 02/19/2020) for [facility].  Future Appointments  Date Time Provider Department Center  02/19/2020  9:40 AM Aviva Signs, CNM [facility] None  02/27/2020  9:15 AM Jerene Bears, MD [facility] None    Wynelle Bourgeois, CNM

## 2020-02-13 LAB — GC/CHLAMYDIA PROBE AMP (~~LOC~~) NOT AT ARMC
Chlamydia: NEGATIVE
Comment: NEGATIVE
Comment: NORMAL
Neisseria Gonorrhea: NEGATIVE

## 2020-02-16 LAB — CULTURE, BETA STREP (GROUP B ONLY): Strep Gp B Culture: NEGATIVE

## 2020-02-19 ENCOUNTER — Ambulatory Visit (INDEPENDENT_AMBULATORY_CARE_PROVIDER_SITE_OTHER): Payer: Medicaid Other | Admitting: Advanced Practice Midwife

## 2020-02-19 ENCOUNTER — Other Ambulatory Visit: Payer: Self-pay

## 2020-02-19 ENCOUNTER — Encounter: Payer: Self-pay | Admitting: Advanced Practice Midwife

## 2020-02-19 DIAGNOSIS — O0933 Supervision of pregnancy with insufficient antenatal care, third trimester: Secondary | ICD-10-CM

## 2020-02-19 DIAGNOSIS — Z789 Other specified health status: Secondary | ICD-10-CM

## 2020-02-19 DIAGNOSIS — Z3480 Encounter for supervision of other normal pregnancy, unspecified trimester: Secondary | ICD-10-CM

## 2020-02-19 NOTE — Progress Notes (Signed)
   PRENATAL VISIT NOTE  Subjective:  Patricia Bonilla is a 29 y.o. G3P2002 at [redacted]w[redacted]d being seen today for ongoing prenatal care.  She is currently monitored for the following issues for this low-risk pregnancy and has Supervision of other normal pregnancy, antepartum; Late prenatal care affecting pregnancy; Language barrier; Mixed conductive and sensorineural hearing loss of right ear with restricted hearing of left ear; Tympanic membrane central perforation, right; and Anemia on their problem list.  Patient reports occasional contractions.  Contractions: Irritability. Vag. Bleeding: None.  Movement: Present. Denies leaking of fluid.   The following portions of the patient's history were reviewed and updated as appropriate: allergies, current medications, past family history, past medical history, past social history, past surgical history and problem list.   Objective:   Vitals:   02/19/20 0942  BP: (!) 100/52  Pulse: 81  Weight: 159 lb (72.1 kg)    Fetal Status: Fetal Heart Rate (bpm): 140 Fundal Height: 37 cm Movement: Present     General:  Alert, oriented and cooperative. Patient is in no acute distress.  Skin: Skin is warm and dry. No rash noted.   Cardiovascular: Normal heart rate noted  Respiratory: Normal respiratory effort, no problems with respiration noted  Abdomen: Soft, gravid, appropriate for gestational age.  Pain/Pressure: Present     Pelvic: Cervical exam deferred        Extremities: Normal range of motion.  Edema: None  Mental Status: Normal mood and affect. Normal behavior. Normal judgment and thought content.   Assessment and Plan:  Pregnancy: G3P2002 at [redacted]w[redacted]d 1. Supervision of other normal pregnancy, antepartum      GBS Negative  2. Late prenatal care affecting pregnancy in third trimester     3. Language barrier    Live interpretor present  Term labor symptoms and general obstetric precautions including but not limited to vaginal bleeding, contractions, leaking  of fluid and fetal movement were reviewed in detail with the patient. Please refer to After Visit Summary for other counseling recommendations.   Return in about 1 week (around 02/26/2020) for [facility].  Future Appointments  Date Time Provider Department Center  02/27/2020  9:15 AM Jerene Bears, MD [facility] None    Wynelle Bourgeois, CNM

## 2020-02-19 NOTE — Patient Instructions (Signed)
Labor and Vaginal Delivery  Vaginal delivery means that you give birth by pushing your baby out of your birth canal (vagina). A team of health care providers will help you before, during, and after vaginal delivery. Birth experiences are unique for every woman and every pregnancy, and birth experiences vary depending on where you choose to give birth. What happens when I arrive at the birth center or hospital? Once you are in labor and have been admitted into the hospital or birth center, your health care provider may:  Review your pregnancy history and any concerns that you have.  Insert an IV into one of your veins. This may be used to give you fluids and medicines.  Check your blood pressure, pulse, temperature, and heart rate (vital signs).  Check whether your bag of water (amniotic sac) has broken (ruptured).  Talk with you about your birth plan and discuss pain control options. Monitoring Your health care provider may monitor your contractions (uterine monitoring) and your baby's heart rate (fetal monitoring). You may need to be monitored:  Often, but not continuously (intermittently).  All the time or for long periods at a time (continuously). Continuous monitoring may be needed if: ? You are taking certain medicines, such as medicine to relieve pain or make your contractions stronger. ? You have pregnancy or labor complications. Monitoring may be done by:  Placing a special stethoscope or a handheld monitoring device on your abdomen to check your baby's heartbeat and to check for contractions.  Placing monitors on your abdomen (external monitors) to record your baby's heartbeat and the frequency and length of contractions.  Placing monitors inside your uterus through your vagina (internal monitors) to record your baby's heartbeat and the frequency, length, and strength of your contractions. Depending on the type of monitor, it may remain in your uterus or on your baby's head  until birth.  Telemetry. This is a type of continuous monitoring that can be done with external or internal monitors. Instead of having to stay in bed, you are able to move around during telemetry. Physical exam Your health care provider may perform frequent physical exams. This may include:  Checking how and where your baby is positioned in your uterus.  Checking your cervix to determine: ? Whether it is thinning out (effacing). ? Whether it is opening up (dilating). What happens during labor and delivery?  Normal labor and delivery is divided into the following three stages: Stage 1  This is the longest stage of labor.  This stage can last for hours or days.  Throughout this stage, you will feel contractions. Contractions generally feel mild, infrequent, and irregular at first. They get stronger, more frequent (about every 2-3 minutes), and more regular as you move through this stage.  This stage ends when your cervix is completely dilated to 4 inches (10 cm) and completely effaced. Stage 2  This stage starts once your cervix is completely effaced and dilated and lasts until the delivery of your baby.  This stage may last from 20 minutes to 2 hours.  This is the stage where you will feel an urge to push your baby out of your vagina.  You may feel stretching and burning pain, especially when the widest part of your baby's head passes through the vaginal opening (crowning).  Once your baby is delivered, the umbilical cord will be clamped and cut. This usually occurs after waiting a period of 1-2 minutes after delivery.  Your baby will be placed on your   bare chest (skin-to-skin contact) in an upright position and covered with a warm blanket. Watch your baby for feeding cues, like rooting or sucking, and help the baby to your breast for his or her first feeding. Stage 3  This stage starts immediately after the birth of your baby and ends after you deliver the placenta.  This  stage may take anywhere from 5 to 30 minutes.  After your baby has been delivered, you will feel contractions as your body expels the placenta and your uterus contracts to control bleeding. What can I expect after labor and delivery?  After labor is over, you and your baby will be monitored closely until you are ready to go home to ensure that you are both healthy. Your health care team will teach you how to care for yourself and your baby.  You and your baby will stay in the same room (rooming in) during your hospital stay. This will encourage early bonding and successful breastfeeding.  You may continue to receive fluids and medicines through an IV.  Your uterus will be checked and massaged regularly (fundal massage).  You will have some soreness and pain in your abdomen, vagina, and the area of skin between your vaginal opening and your anus (perineum).  If an incision was made near your vagina (episiotomy) or if you had some vaginal tearing during delivery, cold compresses may be placed on your episiotomy or your tear. This helps to reduce pain and swelling.  You may be given a squirt bottle to use instead of wiping when you go to the bathroom. To use the squirt bottle, follow these steps: ? Before you urinate, fill the squirt bottle with warm water. Do not use hot water. ? After you urinate, while you are sitting on the toilet, use the squirt bottle to rinse the area around your urethra and vaginal opening. This rinses away any urine and blood. ? Fill the squirt bottle with clean water every time you use the bathroom.  It is normal to have vaginal bleeding after delivery. Wear a sanitary pad for vaginal bleeding and discharge. Summary  Vaginal delivery means that you will give birth by pushing your baby out of your birth canal (vagina).  Your health care provider may monitor your contractions (uterine monitoring) and your baby's heart rate (fetal monitoring).  Your health care  provider may perform a physical exam.  Normal labor and delivery is divided into three stages.  After labor is over, you and your baby will be monitored closely until you are ready to go home. This information is not intended to replace advice given to you by your health care provider. Make sure you discuss any questions you have with your health care provider. Document Revised: 04/05/2017 Document Reviewed: 04/05/2017 Elsevier Patient Education  2020 [employer]  

## 2020-02-27 ENCOUNTER — Telehealth (HOSPITAL_COMMUNITY): Payer: Self-pay | Admitting: *Deleted

## 2020-02-27 ENCOUNTER — Ambulatory Visit (INDEPENDENT_AMBULATORY_CARE_PROVIDER_SITE_OTHER): Payer: Medicaid Other | Admitting: Obstetrics & Gynecology

## 2020-02-27 ENCOUNTER — Encounter (HOSPITAL_COMMUNITY): Payer: Self-pay | Admitting: *Deleted

## 2020-02-27 ENCOUNTER — Other Ambulatory Visit: Payer: Self-pay | Admitting: Advanced Practice Midwife

## 2020-02-27 ENCOUNTER — Other Ambulatory Visit: Payer: Self-pay

## 2020-02-27 VITALS — BP 93/64 | HR 79 | Wt 160.0 lb

## 2020-02-27 DIAGNOSIS — D649 Anemia, unspecified: Secondary | ICD-10-CM

## 2020-02-27 DIAGNOSIS — Z789 Other specified health status: Secondary | ICD-10-CM

## 2020-02-27 DIAGNOSIS — O0933 Supervision of pregnancy with insufficient antenatal care, third trimester: Secondary | ICD-10-CM

## 2020-02-27 DIAGNOSIS — Z3480 Encounter for supervision of other normal pregnancy, unspecified trimester: Secondary | ICD-10-CM

## 2020-02-27 DIAGNOSIS — O2686 Pruritic urticarial papules and plaques of pregnancy (PUPPP): Secondary | ICD-10-CM

## 2020-02-27 DIAGNOSIS — Z3A39 39 weeks gestation of pregnancy: Secondary | ICD-10-CM

## 2020-02-27 NOTE — Progress Notes (Signed)
   PRENATAL VISIT NOTE  Subjective:  Patricia Bonilla is a 29 y.o. G3P2002 at [redacted]w[redacted]d (by MFM anatomy ultrasound) being seen today for ongoing prenatal care.  She is currently monitored for the following issues for this low-risk pregnancy and has Supervision of other normal pregnancy, antepartum; Late prenatal care affecting pregnancy; Language barrier; Mixed conductive and sensorineural hearing loss of right ear with restricted hearing of left ear; Tympanic membrane central perforation, right; and Anemia on their problem list.  Patient reports continues skin itching.  Steroid cream is helping.  She has good fetal movement..  Contractions: Irregular. Vag. Bleeding: None.  Movement: Present. Denies leaking of fluid.   The following portions of the patient's history were reviewed and updated as appropriate: allergies, current medications, past family history, past medical history, past social history, past surgical history and problem list.   Objective:   Vitals:   02/27/20 0929  BP: 93/64  Pulse: 79    Fetal Status: Fetal Heart Rate (bpm): 140   Movement: Present     General:  Alert, oriented and cooperative. Patient is in no acute distress.  Skin: Skin is warm and dry. No rash noted.   Cardiovascular: Normal heart rate noted  Respiratory: Normal respiratory effort, no problems with respiration noted  Abdomen: Soft, gravid, appropriate for gestational age.  Pain/Pressure: Present     Pelvic: Cervical exam deferred        Extremities: Normal range of motion.  Edema: None  Mental Status: Normal mood and affect. Normal behavior. Normal judgment and thought content.   Assessment and Plan:  Pregnancy: G3P2002 at [redacted]w[redacted]d 1. 39 4/[redacted] weeks gestation of pregnancy - BPP will be scheduled after 40 weeks - Induction scheduled at 41 1/7 (41 0/7 is 03/08/2020)  2. Anemia, unspecified type -She is taking iron and PNV  3. Supervision of other normal pregnancy, antepartum  4. Late prenatal care affecting  pregnancy in third trimester  5. Language barrier -Translator used during entire visit  6. PUPP - Continuing to use steroid cream.  Does not need RF.  Term labor symptoms and general obstetric precautions including but not limited to vaginal bleeding, contractions, leaking of fluid and fetal movement were reviewed in detail with the patient. Please refer to After Visit Summary for other counseling recommendations.    Future Appointments  Date Time Provider Department Center  03/06/2020 10:15 AM Levie Heritage, DO [facility] None    Jerene Bears, MD

## 2020-02-27 NOTE — Telephone Encounter (Signed)
Preadmission screen Interpreter number [phone]

## 2020-02-27 NOTE — Telephone Encounter (Signed)
Preadmission screen Interpreter number [id number]

## 2020-03-02 ENCOUNTER — Other Ambulatory Visit: Payer: Self-pay | Admitting: Advanced Practice Midwife

## 2020-03-02 ENCOUNTER — Other Ambulatory Visit: Payer: Self-pay | Admitting: Obstetrics & Gynecology

## 2020-03-03 ENCOUNTER — Other Ambulatory Visit: Payer: Self-pay | Admitting: Advanced Practice Midwife

## 2020-03-04 ENCOUNTER — Other Ambulatory Visit: Payer: Self-pay

## 2020-03-04 ENCOUNTER — Encounter: Payer: Self-pay | Admitting: *Deleted

## 2020-03-04 ENCOUNTER — Other Ambulatory Visit: Payer: Self-pay | Admitting: Obstetrics & Gynecology

## 2020-03-04 ENCOUNTER — Ambulatory Visit: Payer: Medicaid Other | Admitting: *Deleted

## 2020-03-04 ENCOUNTER — Inpatient Hospital Stay (HOSPITAL_COMMUNITY)
Admission: AD | Admit: 2020-03-04 | Discharge: 2020-03-09 | DRG: 787 | Disposition: A | Discharge status: Home / Self Care | Payer: Medicaid Other | Attending: Family Medicine | Admitting: Family Medicine

## 2020-03-04 ENCOUNTER — Ambulatory Visit (HOSPITAL_BASED_OUTPATIENT_CLINIC_OR_DEPARTMENT_OTHER): Payer: Medicaid Other

## 2020-03-04 ENCOUNTER — Encounter (HOSPITAL_COMMUNITY): Payer: Self-pay | Admitting: Family Medicine

## 2020-03-04 DIAGNOSIS — O0933 Supervision of pregnancy with insufficient antenatal care, third trimester: Secondary | ICD-10-CM

## 2020-03-04 DIAGNOSIS — Z789 Other specified health status: Secondary | ICD-10-CM

## 2020-03-04 DIAGNOSIS — O480 Post-term pregnancy: Principal | ICD-10-CM | POA: Diagnosis present

## 2020-03-04 DIAGNOSIS — Z30430 Encounter for insertion of intrauterine contraceptive device: Secondary | ICD-10-CM | POA: Diagnosis not present

## 2020-03-04 DIAGNOSIS — O324XX0 Maternal care for high head at term, not applicable or unspecified: Secondary | ICD-10-CM | POA: Diagnosis not present

## 2020-03-04 DIAGNOSIS — O3663X0 Maternal care for excessive fetal growth, third trimester, not applicable or unspecified: Secondary | ICD-10-CM | POA: Diagnosis present

## 2020-03-04 DIAGNOSIS — R079 Chest pain, unspecified: Secondary | ICD-10-CM | POA: Diagnosis not present

## 2020-03-04 DIAGNOSIS — H7201 Central perforation of tympanic membrane, right ear: Secondary | ICD-10-CM | POA: Diagnosis present

## 2020-03-04 DIAGNOSIS — O99893 Other specified diseases and conditions complicating puerperium: Secondary | ICD-10-CM | POA: Diagnosis not present

## 2020-03-04 DIAGNOSIS — O0930 Supervision of pregnancy with insufficient antenatal care, unspecified trimester: Secondary | ICD-10-CM

## 2020-03-04 DIAGNOSIS — Z3480 Encounter for supervision of other normal pregnancy, unspecified trimester: Secondary | ICD-10-CM

## 2020-03-04 DIAGNOSIS — Z20822 Contact with and (suspected) exposure to covid-19: Secondary | ICD-10-CM | POA: Diagnosis present

## 2020-03-04 DIAGNOSIS — Z3A40 40 weeks gestation of pregnancy: Secondary | ICD-10-CM

## 2020-03-04 DIAGNOSIS — D649 Anemia, unspecified: Secondary | ICD-10-CM | POA: Diagnosis present

## 2020-03-04 DIAGNOSIS — O9081 Anemia of the puerperium: Secondary | ICD-10-CM | POA: Diagnosis not present

## 2020-03-04 DIAGNOSIS — D62 Acute posthemorrhagic anemia: Secondary | ICD-10-CM | POA: Diagnosis not present

## 2020-03-04 DIAGNOSIS — H90A31 Mixed conductive and sensorineural hearing loss, unilateral, right ear with restricted hearing on the contralateral side: Secondary | ICD-10-CM | POA: Diagnosis present

## 2020-03-04 DIAGNOSIS — O3663X1 Maternal care for excessive fetal growth, third trimester, fetus 1: Secondary | ICD-10-CM

## 2020-03-04 DIAGNOSIS — Z30014 Encounter for initial prescription of intrauterine contraceptive device: Secondary | ICD-10-CM | POA: Diagnosis not present

## 2020-03-04 LAB — CBC
HCT: 32 % — ABNORMAL LOW (ref 36.0–46.0)
Hemoglobin: 10.4 g/dL — ABNORMAL LOW (ref 12.0–15.0)
MCH: 31.4 pg (ref 26.0–34.0)
MCHC: 32.5 g/dL (ref 30.0–36.0)
MCV: 96.7 fL (ref 80.0–100.0)
Platelets: 197 10*3/uL (ref 150–400)
RBC: 3.31 MIL/uL — ABNORMAL LOW (ref 3.87–5.11)
RDW: 14.2 % (ref 11.5–15.5)
WBC: 6.7 10*3/uL (ref 4.0–10.5)
nRBC: 0 % (ref 0.0–0.2)

## 2020-03-04 LAB — RESP PANEL BY RT-PCR (FLU A&B, COVID) ARPGX2
Influenza A by PCR: NEGATIVE
Influenza B by PCR: NEGATIVE
SARS Coronavirus 2 by RT PCR: NEGATIVE

## 2020-03-04 MED ORDER — OXYTOCIN-SODIUM CHLORIDE 30-0.9 UT/500ML-% IV SOLN
1.0000 m[IU]/min | INTRAVENOUS | Status: DC
  Administered 2020-03-04: 17:00:00 2 m[IU]/min via INTRAVENOUS
  Administered 2020-03-06: 04:00:00 18 m[IU]/min via INTRAVENOUS

## 2020-03-04 MED ORDER — MISOPROSTOL 25 MCG QUARTER TABLET: 25.0000 ug | ORAL_TABLET | ORAL | Status: DC | PRN

## 2020-03-04 MED ORDER — OXYTOCIN-SODIUM CHLORIDE 30-0.9 UT/500ML-% IV SOLN
2.5000 [IU]/h | INTRAVENOUS | Status: DC
  Filled 2020-03-04 (×2): qty 500

## 2020-03-04 MED ORDER — SOD CITRATE-CITRIC ACID 500-334 MG/5ML PO SOLN: 30.0000 mL | ORAL | Status: DC | PRN

## 2020-03-04 MED ORDER — ONDANSETRON HCL 4 MG/2ML IJ SOLN: 4.0000 mg | Freq: Four times a day (QID) | INTRAMUSCULAR | Status: DC | PRN

## 2020-03-04 MED ORDER — TERBUTALINE SULFATE 1 MG/ML IJ SOLN: 0.2500 mg | Freq: Once | INTRAMUSCULAR | Status: DC | PRN

## 2020-03-04 MED ORDER — LIDOCAINE HCL (PF) 1 % IJ SOLN
30.0000 mL | INTRAMUSCULAR | Status: DC | PRN
  Filled 2020-03-04: qty 30

## 2020-03-04 MED ORDER — OXYTOCIN BOLUS FROM INFUSION: 333.0000 mL | Freq: Once | INTRAVENOUS | Status: DC

## 2020-03-04 MED ORDER — OXYCODONE-ACETAMINOPHEN 5-325 MG PO TABS
2.0000 | ORAL_TABLET | ORAL | Status: DC | PRN
Start: 2020-03-04 — End: 2020-03-06

## 2020-03-04 MED ORDER — OXYCODONE-ACETAMINOPHEN 5-325 MG PO TABS: 1.0000 | ORAL_TABLET | ORAL | Status: DC | PRN

## 2020-03-04 MED ORDER — PARAGARD INTRAUTERINE COPPER IU IUD
INTRAUTERINE_SYSTEM | Freq: Once | INTRAUTERINE | Status: DC
  Filled 2020-03-04: qty 1

## 2020-03-04 MED ORDER — LACTATED RINGERS IV SOLN: 500.0000 mL | INTRAVENOUS | Status: DC | PRN

## 2020-03-04 MED ORDER — LACTATED RINGERS IV SOLN: INTRAVENOUS | Status: DC

## 2020-03-04 MED ORDER — ACETAMINOPHEN 325 MG PO TABS: 650.0000 mg | ORAL_TABLET | ORAL | Status: DC | PRN

## 2020-03-04 MED ORDER — FLEET ENEMA 7-19 GM/118ML RE ENEM: 1.0000 | ENEMA | RECTAL | Status: DC | PRN

## 2020-03-04 NOTE — H&P (Addendum)
Natori Gudino is a 29 y.o. female presenting for IOL due to macrosomia.  OB History    Gravida  3   Para  2   Term  2   Preterm  0   AB  0   Living  2     SAB  0   IAB  0   Ectopic  0   Multiple  0   Live Births  2          Past Medical History:  Diagnosis Date  . Blood transfusion without reported diagnosis   . Hearing impaired    "from infection when she was a baby"  . History of postpartum hemorrhage    Past Surgical History:  Procedure Laterality Date  . Cyst removed from neck.     Family History: family history includes Hypertension in her mother. Social History:  reports that she has never smoked. She has never used smokeless tobacco. She reports that she does not drink alcohol and does not use drugs.     Maternal Diabetes: No Genetic Screening: Declined Maternal Ultrasounds/Referrals: Other: macrosomia Fetal Ultrasounds or other Referrals:  Referred to Materal Fetal Medicine  Maternal Substance Abuse:  No Significant Maternal Medications:  None Significant Maternal Lab Results:  Group B Strep negative Other Comments:  None  Review of Systems  Constitutional: Negative for chills, fatigue and fever.  HENT: Negative for congestion, rhinorrhea, sneezing and sore throat.   Eyes: Negative for visual disturbance.  Respiratory: Negative for cough and shortness of breath.   Genitourinary: Negative for vaginal bleeding and vaginal discharge.  Allergic/Immunologic: Positive for food allergies.  Neurological: Negative for dizziness and headaches.  All other systems reviewed and are negative.  Maternal Medical History:  Reason for admission: IOL for macrosomia  Contractions: Frequency: irregular.   Perceived severity is mild.    Fetal activity: Perceived fetal activity is normal.   Last perceived fetal movement was within the past hour.    Prenatal complications: Anemia - taking PNV with iron  Prenatal Complications - Diabetes: none.   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 Adrian Blackwater Blood pressure 97/60, pulse 84, temperature 98.1 F (36.7 C), resp. rate 16, height 5\' 2"  (1.575 m), weight 72.2 kg. Exam Physical Exam Vitals and nursing note reviewed. Exam conducted with a chaperone present.  Constitutional:      Appearance: Normal appearance. She is normal weight.  Eyes:     Pupils: Pupils are equal, round, and reactive to light.  Cardiovascular:     Rate and Rhythm: Normal rate.  Pulmonary:     Effort: Pulmonary effort is normal.     Breath sounds: Normal breath sounds.  Skin:    General: Skin is warm and dry.     Capillary Refill: Capillary refill takes less than 2 seconds.  Neurological:     Mental Status: She is alert and oriented to person, place, and time.  Psychiatric:        Mood and Affect: Mood normal.        Behavior: Behavior normal.        Thought Content: Thought content normal.        Judgment: Judgment normal.     Prenatal labs: ABO, Rh: --/--/AB POS (12/21 1548) Antibody:  NEG (12/21 1548) Rubella: 3.74 (08/24 1134) RPR: Non Reactive (10/05 0849)  HBsAg: Negative (08/24 1134)  HIV: Non Reactive (10/05 0849)  GBS: Negative/-- (11/30 1114)   Assessment/Plan:  Thresia Ramanathan is a 29 y.o. G3P2002 at [redacted]w[redacted]d here for IOL due to macrosomia (EFW 4920 g)  Labor: Pitocin started and progressing well -- pain control: patient desires IV pain medication   Fetal Wellbeing: EFW 4920 g by third trimester ultrasound. Cephalic by vaginal exam.  -- GBS (negative) -- continuous fetal monitoring    Postpartum Planning -- boy(no)/both/PP IUD (Paragard)    Sande Rives 03/04/2020, 6:31 PM

## 2020-03-04 NOTE — Progress Notes (Signed)
Patient ID: Patricia Bonilla, female   DOB: 07/02/1990, 29 y.o.   MRN: [id number] Doing well Comfortable  Vitals:   03/04/20 1530 03/04/20 1704 03/04/20 1739 03/04/20 1950  BP:  98/60 97/60 95/61   Pulse:  76 84 70  Resp:      Temp:      Weight: 72.2 kg     Height: 5\' 2"  (1.575 m)      FHR reassuring UCs every 2-80min  Cervical exam deferred  Will continue to observe

## 2020-03-05 DIAGNOSIS — O3663X0 Maternal care for excessive fetal growth, third trimester, not applicable or unspecified: Secondary | ICD-10-CM | POA: Diagnosis not present

## 2020-03-05 DIAGNOSIS — O480 Post-term pregnancy: Secondary | ICD-10-CM | POA: Diagnosis not present

## 2020-03-05 LAB — RPR: RPR Ser Ql: NONREACTIVE

## 2020-03-05 MED ORDER — FENTANYL CITRATE (PF) 100 MCG/2ML IJ SOLN
INTRAMUSCULAR | Status: AC
  Administered 2020-03-05: 16:00:00 100 ug via INTRAVENOUS
  Filled 2020-03-05: qty 2

## 2020-03-05 MED ORDER — FENTANYL CITRATE (PF) 100 MCG/2ML IJ SOLN
100.0000 ug | INTRAMUSCULAR | Status: DC | PRN
  Administered 2020-03-05 (×5): 100 ug via INTRAVENOUS
  Filled 2020-03-05 (×5): qty 2

## 2020-03-05 NOTE — Progress Notes (Signed)
Nepali Stratus interpreter used at bedside.  Reviewed plan of care with patient and support people.  All questions answered at this time.   Kittie Plater RNC-OB

## 2020-03-05 NOTE — Progress Notes (Signed)
Napali Stratus interpreter at bedside. Patient requesting IV pain meds at this time. Dr. Barb Merino notified. Reviewed plan of care and all questions answered.  Kathlene Cote RN, 03/05/2020

## 2020-03-05 NOTE — Progress Notes (Signed)
Patient ID: Patricia Bonilla, female   DOB: Dec 22, 1990, 29 y.o.   MRN: [id number] Doing well but not feeling pain with contractions  Vitals:   03/05/20 0301 03/05/20 0412 03/05/20 0512 03/05/20 0612  BP: (!) 90/55 (!) 83/45 (!) 91/52 97/61  Pulse: 75 71 75 73  Resp: 16  16 17   Temp:   98.1 F (36.7 C)   TempSrc:      Weight:      Height:       Dilation: 4 Effacement (%): 80 Station: -2,Ballotable Presentation: Vertex Exam by:: [email or web address], CNM  FHR reassuring UCs continue regular Patricia Bonilla  WIll increase Pitocin a bit more.  Have held somewhat during night as frequency was so close

## 2020-03-05 NOTE — Progress Notes (Signed)
Napali stratus interpreter at bedside.  RN addressed patient needs.  Reviewed plan of care.  Patient and support people express no concerns or questions at this time.  Kittie Plater RNC-OB

## 2020-03-05 NOTE — Progress Notes (Signed)
Labor Progress Note Patricia Bonilla is a 29 y.o. G3P2002 at [redacted]w[redacted]d presented for IOL secondary to LGA infant by ultrasound.  S: Pt reporting increasing discomfort with contractions.   O:  BP (!) 102/53   Pulse 66   Temp 99.1 F (37.3 C) (Oral)   Resp 20   Ht 5\' 2"  (1.575 m)   Wt 72.2 kg   LMP  (LMP Unknown)   BMI 29.10 kg/m  EFM: baseline 130/moderate variability/+accels, no decels Toco: ctx q3-4 min  CVE: Dilation: 8 Effacement (%): 80 Station: 0 Presentation: Vertex Exam by:: [email or web address] RN   A&P: 29 y.o. 38 [redacted]w[redacted]d presented for IOL secondary to LGA infant by ultrasound. #Labor: Pt initially started on pitocin at 1705 on 12/21. Progressing well s/p AROM. Good fetal tolerance with up-titration of pitocin. #Pain: IV fentanyl; pt does not desire epidural #FWB: Category 1 strip #GBS negative #LGA: EFW 4920g, >99% at [redacted]w[redacted]d. Reassuringly, pt with pelvis proven to 4000g and Leopold's less than EFW by ultrasound. Shoulder precautions and nursing aware.  [redacted]w[redacted]d, MD 7:14 PM

## 2020-03-05 NOTE — Progress Notes (Signed)
Napali Stratus interpreter at bedside. Reviewed plan of care. Patient and family member educated on artifical rupture of membranes and agreeable to procedure with Dr. Barb Merino. All questions answered at this time and plan of care reviewed.   Kittie Plater RNC-OB

## 2020-03-05 NOTE — Progress Notes (Signed)
Patient ID: Patricia Bonilla, female   DOB: January 06, 1991, 29 y.o.   MRN: [id number] Doing well Breathing through contractions which have started to become painful  Vitals:   03/04/20 2216 03/05/20 0008 03/05/20 0100 03/05/20 0205  BP: (!) 99/57 (!) 101/58 (!) 98/58 96/64  Pulse: 71 70 69 74  Resp:  16 17 16   Temp:  98.6 F (37 C) 98.1 F (36.7 C)   TempSrc:  Oral Oral   Weight:      Height:       FHR reactive, category I UCs q72min  Dilation: 3.5 Effacement (%): 80 Station: -2,-3 Presentation: Vertex Exam by:: [email or web address], CNM  Declines analgesia Continue to observe

## 2020-03-05 NOTE — Progress Notes (Signed)
Nepali Stratus interpreter at bedside.  Patient requested IVPM. Reviewed cervical exam findings and plan of care.  All questions answered at this time.  Kittie Plater RNC-OB

## 2020-03-05 NOTE — Progress Notes (Signed)
Patient ID: Patricia Bonilla, female   DOB: 1990/10/30, 29 y.o.   MRN: [id number] Late entry for 2130-2245  Patient became increasingly uncomfortable with rectal pressure  Cervix was nearly complete with a tiny sliver of cervix anteriorly, which easily reduced  FHR stable UCs q  Attempted pushing in various positions Supine, LLateral, Hands/Knees, and right lateral  Baby did not descend despite good maternal effort Fontanel palpated at 1:00 Lots of room posteriorly.   Decision made to Medicate for pain and Labor down Exaggerated sims with peanut

## 2020-03-06 ENCOUNTER — Encounter: Payer: Medicaid Other | Admitting: Family Medicine

## 2020-03-06 ENCOUNTER — Encounter (HOSPITAL_COMMUNITY): Payer: Self-pay | Admitting: Family Medicine

## 2020-03-06 ENCOUNTER — Inpatient Hospital Stay (HOSPITAL_COMMUNITY): Payer: Medicaid Other | Admitting: Anesthesiology

## 2020-03-06 ENCOUNTER — Encounter (HOSPITAL_COMMUNITY)
Admission: AD | Disposition: A | Discharge status: Home / Self Care | Payer: Self-pay | Source: Home / Self Care | Attending: Family Medicine

## 2020-03-06 DIAGNOSIS — O324XX0 Maternal care for high head at term, not applicable or unspecified: Secondary | ICD-10-CM | POA: Diagnosis not present

## 2020-03-06 DIAGNOSIS — Z30014 Encounter for initial prescription of intrauterine contraceptive device: Secondary | ICD-10-CM

## 2020-03-06 DIAGNOSIS — Z20822 Contact with and (suspected) exposure to covid-19: Secondary | ICD-10-CM | POA: Diagnosis not present

## 2020-03-06 DIAGNOSIS — O480 Post-term pregnancy: Secondary | ICD-10-CM | POA: Diagnosis not present

## 2020-03-06 DIAGNOSIS — O3663X0 Maternal care for excessive fetal growth, third trimester, not applicable or unspecified: Secondary | ICD-10-CM | POA: Diagnosis not present

## 2020-03-06 DIAGNOSIS — Z3A40 40 weeks gestation of pregnancy: Secondary | ICD-10-CM | POA: Diagnosis not present

## 2020-03-06 HISTORY — PX: INTRAUTERINE DEVICE (IUD) INSERTION: SHX5877

## 2020-03-06 LAB — CBC
HCT: 26.7 % — ABNORMAL LOW (ref 36.0–46.0)
Hemoglobin: 8.8 g/dL — ABNORMAL LOW (ref 12.0–15.0)
MCH: 31.7 pg (ref 26.0–34.0)
MCHC: 33 g/dL (ref 30.0–36.0)
MCV: 96 fL (ref 80.0–100.0)
Platelets: 170 10*3/uL (ref 150–400)
RBC: 2.78 MIL/uL — ABNORMAL LOW (ref 3.87–5.11)
RDW: 14 % (ref 11.5–15.5)
WBC: 15.9 10*3/uL — ABNORMAL HIGH (ref 4.0–10.5)
nRBC: 0 % (ref 0.0–0.2)

## 2020-03-06 LAB — CREATININE, SERUM
Creatinine, Ser: 0.63 mg/dL (ref 0.44–1.00)
GFR, Estimated: >60 mL/min (ref >60)

## 2020-03-06 SURGERY — Surgical Case
Anesthesia: Epidural

## 2020-03-06 MED ORDER — PHENYLEPHRINE HCL (PRESSORS) 10 MG/ML IV SOLN: INTRAVENOUS | Status: DC | PRN

## 2020-03-06 MED ORDER — DIPHENHYDRAMINE HCL 25 MG PO CAPS
25.0000 mg | ORAL_CAPSULE | Freq: Four times a day (QID) | ORAL | Status: DC | PRN
  Administered 2020-03-06: 13:00:00 25 mg via ORAL

## 2020-03-06 MED ORDER — DIPHENHYDRAMINE HCL 50 MG/ML IJ SOLN: 12.5000 mg | INTRAMUSCULAR | Status: DC | PRN

## 2020-03-06 MED ORDER — NALOXONE HCL 0.4 MG/ML IJ SOLN: 0.4000 mg | INTRAMUSCULAR | Status: DC | PRN

## 2020-03-06 MED ORDER — WITCH HAZEL-GLYCERIN EX PADS: 1.0000 "application " | MEDICATED_PAD | CUTANEOUS | Status: DC | PRN

## 2020-03-06 MED ORDER — FENTANYL CITRATE (PF) 100 MCG/2ML IJ SOLN
INTRAMUSCULAR | Status: DC | PRN
  Administered 2020-03-06: 100 ug via EPIDURAL

## 2020-03-06 MED ORDER — AMISULPRIDE (ANTIEMETIC) 5 MG/2ML IV SOLN: 10.0000 mg | Freq: Once | INTRAVENOUS | Status: DC | PRN

## 2020-03-06 MED ORDER — METHYLERGONOVINE MALEATE 0.2 MG/ML IJ SOLN
INTRAMUSCULAR | Status: DC | PRN
  Administered 2020-03-06 (×2): .2 mg via INTRAMUSCULAR

## 2020-03-06 MED ORDER — SCOPOLAMINE 1 MG/3DAYS TD PT72
1.0000 | MEDICATED_PATCH | Freq: Once | TRANSDERMAL | Status: AC
  Administered 2020-03-06: 08:00:00 1.5 mg via TRANSDERMAL

## 2020-03-06 MED ORDER — NALBUPHINE HCL 10 MG/ML IJ SOLN: 5.0000 mg | Freq: Once | INTRAMUSCULAR | Status: AC | PRN

## 2020-03-06 MED ORDER — BUPIVACAINE HCL (PF) 0.25 % IJ SOLN
INTRAMUSCULAR | Status: AC
  Filled 2020-03-06: qty 30

## 2020-03-06 MED ORDER — FENTANYL CITRATE (PF) 100 MCG/2ML IJ SOLN
INTRAMUSCULAR | Status: AC
  Filled 2020-03-06: qty 2

## 2020-03-06 MED ORDER — PRENATAL MULTIVITAMIN CH
1.0000 | ORAL_TABLET | Freq: Every day | ORAL | Status: DC
  Administered 2020-03-06 – 2020-03-08 (×3): 1 via ORAL
  Filled 2020-03-06 (×3): qty 1

## 2020-03-06 MED ORDER — OXYTOCIN-SODIUM CHLORIDE 30-0.9 UT/500ML-% IV SOLN
INTRAVENOUS | Status: DC | PRN
  Administered 2020-03-06: 30 [IU] via INTRAVENOUS

## 2020-03-06 MED ORDER — SODIUM CHLORIDE 0.9 % IV SOLN
500.0000 mg | INTRAVENOUS | Status: DC
  Administered 2020-03-06: 06:00:00 500 mg via INTRAVENOUS
  Filled 2020-03-06: qty 500

## 2020-03-06 MED ORDER — PROMETHAZINE HCL 25 MG/ML IJ SOLN
INTRAMUSCULAR | Status: AC
  Filled 2020-03-06: qty 1

## 2020-03-06 MED ORDER — MORPHINE SULFATE (PF) 10 MG/ML IV SOLN
INTRAVENOUS | Status: DC | PRN
  Administered 2020-03-06: 3 mg via EPIDURAL

## 2020-03-06 MED ORDER — NALBUPHINE HCL 10 MG/ML IJ SOLN: 5.0000 mg | Freq: Once | INTRAMUSCULAR | Status: DC | PRN

## 2020-03-06 MED ORDER — PHENYLEPHRINE HCL (PRESSORS) 10 MG/ML IV SOLN
INTRAVENOUS | Status: DC | PRN
  Administered 2020-03-06: 40 ug via INTRAVENOUS
  Administered 2020-03-06: 80 ug via INTRAVENOUS
  Administered 2020-03-06: 120 ug via INTRAVENOUS
  Administered 2020-03-06: 80 ug via INTRAVENOUS

## 2020-03-06 MED ORDER — KETOROLAC TROMETHAMINE 30 MG/ML IJ SOLN
INTRAMUSCULAR | Status: AC
  Filled 2020-03-06: qty 1

## 2020-03-06 MED ORDER — LIDOCAINE HCL (PF) 2 % IJ SOLN
INTRAMUSCULAR | Status: AC
  Filled 2020-03-06: qty 5

## 2020-03-06 MED ORDER — EPHEDRINE 5 MG/ML INJ: 10.0000 mg | INTRAVENOUS | Status: DC | PRN

## 2020-03-06 MED ORDER — MORPHINE SULFATE (PF) 0.5 MG/ML IJ SOLN
INTRAMUSCULAR | Status: AC
  Filled 2020-03-06: qty 10

## 2020-03-06 MED ORDER — PHENYLEPHRINE 40 MCG/ML (10ML) SYRINGE FOR IV PUSH (FOR BLOOD PRESSURE SUPPORT)
80.0000 ug | PREFILLED_SYRINGE | INTRAVENOUS | Status: DC | PRN
  Filled 2020-03-06: qty 10

## 2020-03-06 MED ORDER — SODIUM CHLORIDE 0.9% FLUSH: 3.0000 mL | INTRAVENOUS | Status: DC | PRN

## 2020-03-06 MED ORDER — ONDANSETRON HCL 4 MG/2ML IJ SOLN
INTRAMUSCULAR | Status: DC | PRN
  Administered 2020-03-06: 4 mg via INTRAVENOUS

## 2020-03-06 MED ORDER — FENTANYL-BUPIVACAINE-NACL 0.5-0.125-0.9 MG/250ML-% EP SOLN
12.0000 mL/h | EPIDURAL | Status: DC | PRN
Start: 2020-03-06 — End: 2020-03-06
  Filled 2020-03-06: qty 250

## 2020-03-06 MED ORDER — ENOXAPARIN SODIUM 40 MG/0.4ML ~~LOC~~ SOLN
40.0000 mg | SUBCUTANEOUS | Status: DC
  Administered 2020-03-07 – 2020-03-09 (×3): 40 mg via SUBCUTANEOUS
  Filled 2020-03-06 (×3): qty 0.4

## 2020-03-06 MED ORDER — LIDOCAINE-EPINEPHRINE (PF) 2 %-1:200000 IJ SOLN
INTRAMUSCULAR | Status: DC | PRN
  Administered 2020-03-06 (×2): 5 mL via EPIDURAL
  Administered 2020-03-06: 2 mL via EPIDURAL

## 2020-03-06 MED ORDER — IBUPROFEN 800 MG PO TABS
800.0000 mg | ORAL_TABLET | Freq: Three times a day (TID) | ORAL | Status: DC
  Administered 2020-03-07 – 2020-03-09 (×8): 800 mg via ORAL
  Filled 2020-03-06 (×8): qty 1

## 2020-03-06 MED ORDER — TRANEXAMIC ACID-NACL 1000-0.7 MG/100ML-% IV SOLN
INTRAVENOUS | Status: DC | PRN
  Administered 2020-03-06: 1000 mg via INTRAVENOUS

## 2020-03-06 MED ORDER — LACTATED RINGERS IV BOLUS
1000.0000 mL | Freq: Once | INTRAVENOUS | Status: AC
  Administered 2020-03-07: 1000 mL via INTRAVENOUS

## 2020-03-06 MED ORDER — SENNOSIDES-DOCUSATE SODIUM 8.6-50 MG PO TABS
2.0000 | ORAL_TABLET | ORAL | Status: DC
  Administered 2020-03-06 – 2020-03-09 (×4): 2 via ORAL
  Filled 2020-03-06 (×3): qty 2

## 2020-03-06 MED ORDER — ZOLPIDEM TARTRATE 5 MG PO TABS: 5.0000 mg | ORAL_TABLET | Freq: Every evening | ORAL | Status: DC | PRN

## 2020-03-06 MED ORDER — HYDROMORPHONE HCL 1 MG/ML IJ SOLN: 0.2500 mg | INTRAMUSCULAR | Status: DC | PRN

## 2020-03-06 MED ORDER — NALOXONE HCL 4 MG/10ML IJ SOLN
1.0000 ug/kg/h | INTRAVENOUS | Status: DC | PRN
  Filled 2020-03-06: qty 5

## 2020-03-06 MED ORDER — SCOPOLAMINE 1 MG/3DAYS TD PT72: 1.0000 | MEDICATED_PATCH | Freq: Once | TRANSDERMAL | Status: DC

## 2020-03-06 MED ORDER — LACTATED RINGERS IV SOLN: INTRAVENOUS | Status: DC

## 2020-03-06 MED ORDER — KETOROLAC TROMETHAMINE 30 MG/ML IJ SOLN
30.0000 mg | Freq: Once | INTRAMUSCULAR | Status: AC | PRN
  Administered 2020-03-06: 30 mg via INTRAVENOUS

## 2020-03-06 MED ORDER — ONDANSETRON HCL 4 MG/2ML IJ SOLN: 4.0000 mg | Freq: Three times a day (TID) | INTRAMUSCULAR | Status: DC | PRN

## 2020-03-06 MED ORDER — PHENYLEPHRINE 40 MCG/ML (10ML) SYRINGE FOR IV PUSH (FOR BLOOD PRESSURE SUPPORT): 80.0000 ug | PREFILLED_SYRINGE | INTRAVENOUS | Status: DC | PRN

## 2020-03-06 MED ORDER — NALBUPHINE HCL 10 MG/ML IJ SOLN
INTRAMUSCULAR | Status: AC
  Filled 2020-03-06: qty 1

## 2020-03-06 MED ORDER — CEFAZOLIN SODIUM-DEXTROSE 2-4 GM/100ML-% IV SOLN
2.0000 g | INTRAVENOUS | Status: AC
  Administered 2020-03-06: 06:00:00 2 g via INTRAVENOUS
  Filled 2020-03-06: qty 100

## 2020-03-06 MED ORDER — MENTHOL 3 MG MT LOZG: 1.0000 | LOZENGE | OROMUCOSAL | Status: DC | PRN

## 2020-03-06 MED ORDER — SCOPOLAMINE 1 MG/3DAYS TD PT72
MEDICATED_PATCH | TRANSDERMAL | Status: AC
  Filled 2020-03-06: qty 1

## 2020-03-06 MED ORDER — NALBUPHINE HCL 10 MG/ML IJ SOLN
5.0000 mg | Freq: Once | INTRAMUSCULAR | Status: AC | PRN
Start: 2020-03-06 — End: 2020-03-06
  Administered 2020-03-06: 5 mg via INTRAVENOUS

## 2020-03-06 MED ORDER — ONDANSETRON HCL 4 MG/2ML IJ SOLN
INTRAMUSCULAR | Status: AC
  Filled 2020-03-06: qty 2

## 2020-03-06 MED ORDER — PROMETHAZINE HCL 25 MG/ML IJ SOLN: 6.2500 mg | Freq: Once | INTRAMUSCULAR | Status: DC | PRN

## 2020-03-06 MED ORDER — OXYCODONE-ACETAMINOPHEN 5-325 MG PO TABS
1.0000 | ORAL_TABLET | ORAL | Status: DC | PRN
  Administered 2020-03-07 (×2): 1 via ORAL
  Administered 2020-03-08 (×2): 2 via ORAL
  Administered 2020-03-08: 1 via ORAL
  Administered 2020-03-08: 2 via ORAL
  Administered 2020-03-09: 1 via ORAL
  Filled 2020-03-06: qty 2
  Filled 2020-03-06 (×3): qty 1
  Filled 2020-03-06 (×2): qty 2
  Filled 2020-03-06: qty 1

## 2020-03-06 MED ORDER — SIMETHICONE 80 MG PO CHEW
80.0000 mg | CHEWABLE_TABLET | Freq: Three times a day (TID) | ORAL | Status: DC
  Administered 2020-03-06 – 2020-03-08 (×8): 80 mg via ORAL
  Filled 2020-03-06 (×9): qty 1

## 2020-03-06 MED ORDER — TETANUS-DIPHTH-ACELL PERTUSSIS 5-2.5-18.5 LF-MCG/0.5 IM SUSY: 0.5000 mL | PREFILLED_SYRINGE | Freq: Once | INTRAMUSCULAR | Status: DC

## 2020-03-06 MED ORDER — DIPHENHYDRAMINE HCL 50 MG/ML IJ SOLN
12.5000 mg | INTRAMUSCULAR | Status: DC | PRN
Start: 2020-03-06 — End: 2020-03-06

## 2020-03-06 MED ORDER — EPHEDRINE SULFATE 50 MG/ML IJ SOLN: INTRAMUSCULAR | Status: DC | PRN

## 2020-03-06 MED ORDER — DIPHENHYDRAMINE HCL 25 MG PO CAPS
25.0000 mg | ORAL_CAPSULE | ORAL | Status: DC | PRN
  Filled 2020-03-06: qty 1

## 2020-03-06 MED ORDER — DEXTROSE 5 % IV SOLN
1.0000 ug/kg/h | INTRAVENOUS | Status: DC | PRN
Start: 2020-03-06 — End: 2020-03-07
  Filled 2020-03-06: qty 5

## 2020-03-06 MED ORDER — LACTATED RINGERS IV SOLN
500.0000 mL | Freq: Once | INTRAVENOUS | Status: AC
  Administered 2020-03-06: 01:00:00 500 mL via INTRAVENOUS

## 2020-03-06 MED ORDER — NALBUPHINE HCL 10 MG/ML IJ SOLN
5.0000 mg | INTRAMUSCULAR | Status: DC | PRN
  Administered 2020-03-06 (×2): 5 mg via INTRAVENOUS

## 2020-03-06 MED ORDER — MEPERIDINE HCL 25 MG/ML IJ SOLN: 6.2500 mg | INTRAMUSCULAR | Status: DC | PRN

## 2020-03-06 MED ORDER — COCONUT OIL OIL: 1.0000 "application " | TOPICAL_OIL | Status: DC | PRN

## 2020-03-06 MED ORDER — ACETAMINOPHEN 10 MG/ML IV SOLN: 1000.0000 mg | Freq: Once | INTRAVENOUS | Status: DC | PRN

## 2020-03-06 MED ORDER — SODIUM CHLORIDE 0.9 % IV SOLN
INTRAVENOUS | Status: AC
  Filled 2020-03-06: qty 500

## 2020-03-06 MED ORDER — DIPHENHYDRAMINE HCL 25 MG PO CAPS: 25.0000 mg | ORAL_CAPSULE | ORAL | Status: DC | PRN

## 2020-03-06 MED ORDER — NALBUPHINE HCL 10 MG/ML IJ SOLN
5.0000 mg | INTRAMUSCULAR | Status: DC | PRN
  Filled 2020-03-06 (×2): qty 1

## 2020-03-06 MED ORDER — LACTATED RINGERS IV SOLN: INTRAVENOUS | Status: DC | PRN

## 2020-03-06 MED ORDER — LIDOCAINE HCL (PF) 1 % IJ SOLN
INTRAMUSCULAR | Status: DC | PRN
  Administered 2020-03-06: 5 mL via EPIDURAL
  Administered 2020-03-06: 2 mL via EPIDURAL
  Administered 2020-03-06: 3 mL via EPIDURAL

## 2020-03-06 MED ORDER — PROMETHAZINE HCL 25 MG/ML IJ SOLN
6.2500 mg | Freq: Once | INTRAMUSCULAR | Status: AC | PRN
  Administered 2020-03-06: 6.25 mg via INTRAVENOUS

## 2020-03-06 MED ORDER — SIMETHICONE 80 MG PO CHEW: 80.0000 mg | CHEWABLE_TABLET | ORAL | Status: DC | PRN

## 2020-03-06 MED ORDER — BUPIVACAINE HCL 0.25 % IJ SOLN
INTRAMUSCULAR | Status: DC | PRN
  Administered 2020-03-06: 30 mL

## 2020-03-06 MED ORDER — NALBUPHINE HCL 10 MG/ML IJ SOLN: 5.0000 mg | INTRAMUSCULAR | Status: DC | PRN

## 2020-03-06 MED ORDER — SOD CITRATE-CITRIC ACID 500-334 MG/5ML PO SOLN
30.0000 mL | ORAL | Status: AC
  Administered 2020-03-06: 06:00:00 30 mL via ORAL
  Filled 2020-03-06: qty 15

## 2020-03-06 MED ORDER — SODIUM CHLORIDE 0.9% FLUSH
3.0000 mL | INTRAVENOUS | Status: DC | PRN
Start: 2020-03-06 — End: 2020-03-09

## 2020-03-06 MED ORDER — OXYTOCIN-SODIUM CHLORIDE 30-0.9 UT/500ML-% IV SOLN
2.5000 [IU]/h | INTRAVENOUS | Status: AC
  Administered 2020-03-06: 10:00:00 2.5 [IU]/h via INTRAVENOUS
  Filled 2020-03-06: qty 500

## 2020-03-06 MED ORDER — DIBUCAINE (PERIANAL) 1 % EX OINT: 1.0000 "application " | TOPICAL_OINTMENT | CUTANEOUS | Status: DC | PRN

## 2020-03-06 MED ORDER — ACETAMINOPHEN 500 MG PO TABS
1000.0000 mg | ORAL_TABLET | Freq: Once | ORAL | Status: AC
  Administered 2020-03-06: 10:00:00 1000 mg via ORAL
  Filled 2020-03-06: qty 2

## 2020-03-06 MED ORDER — SODIUM CHLORIDE (PF) 0.9 % IJ SOLN
INTRAMUSCULAR | Status: DC | PRN
  Administered 2020-03-06: 12 mL/h via EPIDURAL

## 2020-03-06 MED ORDER — SODIUM CHLORIDE 0.9 % IV SOLN: INTRAVENOUS | Status: DC | PRN

## 2020-03-06 SURGICAL SUPPLY — 38 items
BENZOIN TINCTURE PRP APPL 2/3 (GAUZE/BANDAGES/DRESSINGS) ×4 IMPLANT
CLAMP CORD UMBIL (MISCELLANEOUS) ×4 IMPLANT
CLOSURE STERI STRIP 1/2 X4 (GAUZE/BANDAGES/DRESSINGS) ×3 IMPLANT
CLOSURE WOUND 1/2 X4 (GAUZE/BANDAGES/DRESSINGS) ×1
CLOTH BEACON ORANGE TIMEOUT ST (SAFETY) ×4 IMPLANT
DRSG OPSITE POSTOP 4X10 (GAUZE/BANDAGES/DRESSINGS) ×4 IMPLANT
ELECT REM PT RETURN 9FT ADLT (ELECTROSURGICAL) ×4
ELECTRODE REM PT RTRN 9FT ADLT (ELECTROSURGICAL) ×2 IMPLANT
EXTRACTOR VACUUM M CUP 4 TUBE (SUCTIONS) IMPLANT
EXTRACTOR VACUUM M CUP 4' TUBE (SUCTIONS)
GLOVE BIOGEL PI IND STRL 7.0 (GLOVE) ×6 IMPLANT
GLOVE BIOGEL PI INDICATOR 7.0 (GLOVE) ×6
GLOVE ECLIPSE 7.0 STRL STRAW (GLOVE) ×4 IMPLANT
GOWN STRL REUS W/TWL LRG LVL3 (GOWN DISPOSABLE) ×8 IMPLANT
HEMOSTAT ARISTA ABSORB 3G PWDR (HEMOSTASIS) ×4 IMPLANT
KIT ABG SYR 3ML LUER SLIP (SYRINGE) ×4 IMPLANT
NDL SAFETY ECLIPSE 18X1.5 (NEEDLE) ×2 IMPLANT
NEEDLE HYPO 18GX1.5 SHARP (NEEDLE) ×2
NEEDLE HYPO 22GX1.5 SAFETY (NEEDLE) ×4 IMPLANT
NEEDLE HYPO 25X5/8 SAFETYGLIDE (NEEDLE) ×4 IMPLANT
NS IRRIG 1000ML POUR BTL (IV SOLUTION) ×4 IMPLANT
PACK C SECTION WH (CUSTOM PROCEDURE TRAY) ×4 IMPLANT
PAD ABD 7.5X8 STRL (GAUZE/BANDAGES/DRESSINGS) ×4 IMPLANT
PAD OB MATERNITY 4.3X12.25 (PERSONAL CARE ITEMS) ×4 IMPLANT
PENCIL SMOKE EVAC W/HOLSTER (ELECTROSURGICAL) ×4 IMPLANT
Paraguard ×4 IMPLANT
RTRCTR C-SECT PINK 25CM LRG (MISCELLANEOUS) ×4 IMPLANT
STRIP CLOSURE SKIN 1/2X4 (GAUZE/BANDAGES/DRESSINGS) ×3 IMPLANT
SUT MNCRL 0 VIOLET CTX 36 (SUTURE) ×6 IMPLANT
SUT MONOCRYL 0 CTX 36 (SUTURE) ×6
SUT PLAIN 2 0 XLH (SUTURE) ×4 IMPLANT
SUT VIC AB 0 CTX 36 (SUTURE) ×2
SUT VIC AB 0 CTX36XBRD ANBCTRL (SUTURE) ×2 IMPLANT
SUT VIC AB 4-0 KS 27 (SUTURE) ×8 IMPLANT
SYR 30ML LL (SYRINGE) ×8 IMPLANT
TOWEL OR 17X24 6PK STRL BLUE (TOWEL DISPOSABLE) ×4 IMPLANT
TRAY FOLEY W/BAG SLVR 14FR LF (SET/KITS/TRAYS/PACK) ×4 IMPLANT
WATER STERILE IRR 1000ML POUR (IV SOLUTION) ×8 IMPLANT

## 2020-03-06 NOTE — Anesthesia Procedure Notes (Signed)
Epidural Patient location during procedure: OB Start time: 03/06/2020 1:24 AM End time: 03/06/2020 1:31 AM  Staffing Anesthesiologist: Marcene Duos, MD Performed: anesthesiologist   Preanesthetic Checklist Completed: patient identified, IV checked, site marked, risks and benefits discussed, surgical consent, monitors and equipment checked, pre-op evaluation and timeout performed  Epidural Patient position: sitting Prep: DuraPrep and site prepped and draped Patient monitoring: continuous pulse ox and blood pressure Approach: midline Location: L4-L5 Injection technique: LOR air  Needle:  Needle type: Tuohy  Needle gauge: 17 G Needle length: 9 cm and 9 Needle insertion depth: 4 cm Catheter type: closed end flexible Catheter size: 19 Gauge Catheter at skin depth: 9 cm Test dose: negative  Assessment Events: blood not aspirated, injection not painful, no injection resistance, no paresthesia and negative IV test

## 2020-03-06 NOTE — Op Note (Signed)
Operative Note   SURGERY DATE: 03/06/2020  PRE-OP DIAGNOSIS:  *Pregnancy at [redacted]w[redacted]d * Arrest of Descent * LGA * Desires contraception   POST-OP DIAGNOSIS: Same   PROCEDURE: primary low transverse cesarean section via pfannenstiel skin incision with double layer uterine closure, Paragard IUD placement   SURGEON: Surgeon(s) and Role:    Shawnie Pons, Shelbie Proctor, MD - Primary    * Marieann Zipp, Arlana Pouch, MD - Assisting  ANESTHESIA: epidural  ESTIMATED BLOOD LOSS: 1200  DRAINS: 105 mL UOP via indwelling foley  TOTAL IV FLUIDS: crystalloid  VTE PROPHYLAXIS: SCDs to bilateral lower extremities  ANTIBIOTICS: Two grams of Cefazolin were given and 500mg  of azithromycin, within 1 hour of skin incision  SPECIMENS: placenta  COMPLICATIONS: postpartum hemorrhage (1200cc)   INDICATIONS: Arrest of Dilation   FINDINGS: No intra-abdominal adhesions were noted. Grossly normal uterus, tubes and ovaries. clear amniotic fluid, cephalic female infant, weight 9.5 lbs, APGARs 9/9, intact placenta.  PROCEDURE IN DETAIL: The patient was taken to the operating room where anesthesia was administered and normal fetal heart tones were confirmed. She was then prepped and draped in the normal fashion in the dorsal supine position with a leftward tilt.  After a time out was performed, a pfannensteil  skin incision was made with the scalpel and carried through to the underlying layer of fascia. The fascia was then incised at the midline and this incision was extended laterally bluntly.The rectus muscles were then separated in the midline and the peritoneum was entered bluntly. The alexis retractor was inserted.   A low transverse hysterotomy was made with the scalpel until the endometrial cavity was breached and the amniotic sac ruptured yielding clear amniotic fluid. This incision was extended bluntly and the infant's head, shoulders and body were delivered atraumatically.The cord was clamped x 2 and cut, and the infant  was handed to the awaiting pediatricians, after delayed cord clamping was done.  The placenta was then gradually expressed from the uterus and then the uterus was exteriorized and cleared of all clots and debris. The Paragard IUD was placed in the uterine fundus and strings were tucked into the cervix. 1g TXA and Methergine were administered due to uterine atony. The hysterotomy was repaired with a running suture of 1-0 monocryl. A second imbricating layer of 1-0 monocyrl suture was then placed. Several figure-of-eight sutures of monocryl were added to achieve excellent hemostasis.    The uterus and adnexa were then returned to the abdomen, and the hysterotomy and all operative sites were reinspected and excellent hemostasis was noted after irrigation and suction of the abdomen with warm saline.  The peritoneum was closed with a running stitch of 3-0 Vicryl. The fascia was reapproximated with 0 Vicryl in a simple running fashion bilaterally. The subcutaneous layer was then reapproximated with interrupted sutures of 2-0 plain gut, and the skin was then closed with 4-0 monocryl, in a subcuticular fashion.  The patient  tolerated the procedure well. Sponge, lap, needle, and instrument counts were correct x 2. The patient was transferred to the recovery room awake, alert and breathing independently in stable condition.   11/9, MD [facility] Family Medicine Fellow, [facility] for [facility], [facility]

## 2020-03-06 NOTE — Progress Notes (Addendum)
Patient ID: Patricia Bonilla, female   DOB: 08/15/1990, 29 y.o.   MRN: [id number]  Patient is complete since 2130. Pushed x 2 hours total.  Baby is direct OT. No real descent past 0-+1. Despite good maternal pushing effort, there is not any descent with pushing. FHR is reassuring. Attempts at manual rotation were unsuccessful. After careful consideration, she is amenable to PLTCS. Risks reviewed.

## 2020-03-06 NOTE — Transfer of Care (Signed)
Immediate Anesthesia Transfer of Care Note  Patient: Patricia Bonilla  Procedure(s) Performed: CESAREAN SECTION INTRAUTERINE DEVICE (IUD) INSERTION (N/A )  Patient Location: PACU  Anesthesia Type:Regional  Level of Consciousness: awake, alert  and oriented  Airway & Oxygen Therapy: Patient Spontanous Breathing  Post-op Assessment: Report given to RN and Post -op Vital signs reviewed and stable  Post vital signs: Reviewed and stable  Last Vitals:  Vitals Value Taken Time  BP 163/148 03/06/20 0715  Temp    Pulse 113 03/06/20 0717  Resp 33 03/06/20 0717  SpO2 100 % 03/06/20 0717  Vitals shown include unvalidated device data.  Last Pain:  Vitals:   03/05/20 2338  TempSrc: Oral  PainSc:          Complications: No complications documented.

## 2020-03-06 NOTE — Progress Notes (Signed)
Patient ID: Patricia Bonilla, female   DOB: 1990/07/24, 29 y.o.   MRN: [id number] Pushed for over an hour Vitals:   03/06/20 0215 03/06/20 0231 03/06/20 0300 03/06/20 0330  BP: 102/65 114/61 108/67 113/61  Pulse: 79 85 77 76  Resp: 16 18 20 20   Temp:      TempSrc:      SpO2: 99%     Weight:      Height:       Had progressed Caput and molding of leading edge of head to +1+2 station, but biparietal diameter was more like 0 station  Dr came and examined patient She tried to rotate head to no avail  Discussed indication for Cesarean Delivery Patient accepts plan

## 2020-03-06 NOTE — Anesthesia Preprocedure Evaluation (Signed)
Anesthesia Evaluation  Patient identified by MRN, date of birth, ID band Patient awake    Reviewed: Allergy & Precautions, Patient's Chart, lab work & pertinent test results  Airway Mallampati: II  TM Distance: >3 FB     Dental   Pulmonary neg pulmonary ROS,    breath sounds clear to auscultation       Cardiovascular negative cardio ROS   Rhythm:Regular Rate:Normal     Neuro/Psych negative neurological ROS     GI/Hepatic negative GI ROS, Neg liver ROS,   Endo/Other  negative endocrine ROS  Renal/GU negative Renal ROS     Musculoskeletal   Abdominal   Peds  Hematology  (+) anemia ,   Anesthesia Other Findings   Reproductive/Obstetrics (+) Pregnancy                             Anesthesia Physical Anesthesia Plan  ASA: II  Anesthesia Plan: Epidural   Post-op Pain Management:    Induction:   PONV Risk Score and Plan: Treatment may vary due to age or medical condition  Airway Management Planned: Natural Airway  Additional Equipment:   Intra-op Plan:   Post-operative Plan:   Informed Consent: I have reviewed the patients History and Physical, chart, labs and discussed the procedure including the risks, benefits and alternatives for the proposed anesthesia with the patient or authorized representative who has indicated his/her understanding and acceptance.       Plan Discussed with: CRNA  Anesthesia Plan Comments:         Anesthesia Quick Evaluation

## 2020-03-06 NOTE — Discharge Summary (Signed)
Postpartum Discharge Summary  Date of Service updated 03/09/20     Patient Name: Patricia Bonilla DOB: 11/19/1990 MRN: [id number]  Date of admission: 03/04/2020 Delivery date:03/06/2020  Delivering provider: Donnamae Jude  Date of discharge: 03/09/2020  Admitting diagnosis: Post-dates pregnancy [O48.0] Intrauterine pregnancy: [redacted]w[redacted]d    Secondary diagnosis:  Active Problems:   Supervision of other normal pregnancy, antepartum   Late prenatal care affecting pregnancy   Language barrier   Mixed conductive and sensorineural hearing loss of right ear with restricted hearing of left ear   Tympanic membrane central perforation, right   Anemia   Post-dates pregnancy   Cesarean delivery delivered  Additional problems: none    Discharge diagnosis: Term Pregnancy Delivered                                              Post partum procedures:blood transfusion, post placental paragard placement Augmentation: AROM, Pitocin, Cytotec and IP Foley Complications: [id number] Hospital course: Induction of Labor With Cesarean Section   29y.o. yo G3P3003 at 489w4das admitted to the hospital 03/04/2020 for induction of labor. Patient had a labor course significant for being admitted for IOl in setting of LGA. She received FB, cytotec, and pitocin. She progressed to complete and pushed for over two hours without any fetal descent. The patient went for cesarean section due to Arrest of Descent. Delivery details are as follows: Membrane Rupture Time/Date: 12:35 PM ,03/05/2020   Delivery Method:C-Section, Low Transverse  Details of operation can be found in separate operative Note.  Patient had an uncomplicated postpartum course. She is ambulating, tolerating a regular diet, passing flatus, and urinating well.  Patient is discharged home in stable condition on 03/09/20.      Newborn Data: Birth date:03/06/2020  Birth time:6:04 AM  Gender:Female  Living status:Living  Apgars:9 ,9  Weight:4290 g                                  Magnesium Sulfate received: No BMZ received: No Rhophylac:N/A MMR:No T-DaP:Given postpartum Flu: No Transfusion:Yes  Physical exam  Vitals:   03/08/20 1713 03/08/20 1932 03/08/20 2048 03/09/20 0507  BP: 95/60 124/71 104/67 103/67  Pulse: (!) 48 (!) 48 (!) 46 64  Resp: 16 16 15 18   Temp: 98.4 F (36.9 C) 98.5 F (36.9 C) 98.1 F (36.7 C) 98.3 F (36.8 C)  TempSrc: Oral Oral Oral Oral  SpO2: 99% 100% 98% 99%  Weight:      Height:       General: alert, cooperative and no distress Lochia: appropriate Uterine Fundus: firm Incision: Dressing is clean, dry, and intact DVT Evaluation: No evidence of DVT seen on physical exam. Labs: Lab Results  Component Value Date   WBC 8.2 03/08/2020   HGB 8.9 (L) 03/08/2020   HCT 26.1 (L) 03/08/2020   MCV 94.2 03/08/2020   PLT 153 03/08/2020   CMP Latest Ref Rng & Units 03/08/2020  Glucose 70 - 99 mg/dL 76  BUN 6 - 20 mg/dL 7  Creatinine 0.44 - 1.00 mg/dL 0.50  Sodium 135 - 145 mmol/L 140  Potassium 3.5 - 5.1 mmol/L 4.2  Chloride 98 - 111 mmol/L 107  CO2 22 - 32 mmol/L 24  Calcium 8.9 - 10.3 mg/dL 8.5(L)   EdFlavia Shipper  Score: Edinburgh Postnatal Depression Scale Screening Tool 03/07/2020  I have been able to laugh and see the funny side of things. (No Data)     After visit meds:  Allergies as of 03/09/2020      Reactions   Avocado Other (See Comments)   Ear and tongue itches      Medication List    STOP taking these medications   cetirizine 10 MG tablet Commonly known as: ZYRTEC   ergocalciferol 1.25 MG (50000 UT) capsule Commonly known as: VITAMIN D2   FiberCon 625 MG tablet Generic drug: polycarbophil   Hemocyte-F 324-1 MG Tabs Generic drug: Ferrous Fumarate-Folic Acid   hydrocortisone cream 1 %   montelukast 10 MG tablet Commonly known as: SINGULAIR     TAKE these medications   acetaminophen 325 MG tablet Commonly known as: Tylenol Take 2 tablets (650 mg total) by mouth  every 6 (six) hours.   ascorbic acid 250 MG tablet Commonly known as: VITAMIN C Take 1 tablet (250 mg total) by mouth every other day. Start taking on: March 10, 2020   coconut oil Oil Apply 1 application topically as needed (nipple pain).   ferrous sulfate 325 (65 FE) MG tablet Take 1 tablet (325 mg total) by mouth every other day. Start taking on: March 10, 2020   fluticasone 50 MCG/ACT nasal spray Commonly known as: FLONASE SMARTSIG:1 Spray(s) Both Nares Twice Daily PRN   ibuprofen 800 MG tablet Commonly known as: ADVIL Take 1 tablet (800 mg total) by mouth every 8 (eight) hours as needed for moderate pain or cramping.   oxyCODONE 5 MG immediate release tablet Commonly known as: Roxicodone Take 1 tablet (5 mg total) by mouth every 6 (six) hours as needed for severe pain or breakthrough pain.   Prenatal Vitamin 27-0.8 MG Tabs Take 1 tablet by mouth daily.        Discharge home in stable condition Infant Feeding: Breast Infant Disposition:home with mother Discharge instruction: per After Visit Summary and Postpartum booklet. Activity: Advance as tolerated. Pelvic rest for 6 weeks.  Diet: routine diet Future Appointments: Future Appointments  Date Time Provider [place]  03/18/2020 11:10 AM Seabron Spates, CNM [facility] None  04/08/2020 10:50 AM Seabron Spates, CNM [facility] None   Follow up Visit:   Please schedule this patient for a In person postpartum visit in 6 weeks with the following provider: Any provider. Additional Postpartum F/U:Incision check 1 week  Low risk pregnancy complicated by: LGA Delivery mode:  C-Section, Low Transverse  Anticipated Birth Control:  PP IUD placed, string check at postpartum visit   81/85/6314 Arrie Senate, MD

## 2020-03-07 LAB — CBC
HCT: 19.9 % — ABNORMAL LOW (ref 36.0–46.0)
HCT: 25.4 % — ABNORMAL LOW (ref 36.0–46.0)
Hemoglobin: 6.8 g/dL — CL (ref 12.0–15.0)
Hemoglobin: 8.4 g/dL — ABNORMAL LOW (ref 12.0–15.0)
MCH: 32.7 pg (ref 26.0–34.0)
MCH: 31.1 pg (ref 26.0–34.0)
MCHC: 34.2 g/dL (ref 30.0–36.0)
MCHC: 33.1 g/dL (ref 30.0–36.0)
MCV: 95.7 fL (ref 80.0–100.0)
MCV: 94.1 fL (ref 80.0–100.0)
Platelets: 135 10*3/uL — ABNORMAL LOW (ref 150–400)
Platelets: 141 10*3/uL — ABNORMAL LOW (ref 150–400)
RBC: 2.08 MIL/uL — ABNORMAL LOW (ref 3.87–5.11)
RBC: 2.7 MIL/uL — ABNORMAL LOW (ref 3.87–5.11)
RDW: 14.2 % (ref 11.5–15.5)
RDW: 15.8 % — ABNORMAL HIGH (ref 11.5–15.5)
WBC: 9.5 10*3/uL (ref 4.0–10.5)
WBC: 9.6 10*3/uL (ref 4.0–10.5)
nRBC: 0 % (ref 0.0–0.2)
nRBC: 0 % (ref 0.0–0.2)

## 2020-03-07 LAB — PREPARE RBC (CROSSMATCH)

## 2020-03-07 LAB — ABO/RH: ABO/RH(D): AB POS

## 2020-03-07 MED ORDER — SODIUM CHLORIDE 0.9% IV SOLUTION: Freq: Once | INTRAVENOUS | Status: DC

## 2020-03-07 MED ORDER — PROMETHAZINE HCL 25 MG/ML IJ SOLN: 6.2500 mg | Freq: Once | INTRAMUSCULAR | Status: DC | PRN

## 2020-03-07 MED ORDER — SODIUM CHLORIDE 0.9% IV SOLUTION: Freq: Once | INTRAVENOUS | Status: AC

## 2020-03-07 MED ORDER — DIPHENHYDRAMINE HCL 50 MG/ML IJ SOLN
25.0000 mg | Freq: Once | INTRAMUSCULAR | Status: AC
  Administered 2020-03-07: 03:00:00 25 mg via INTRAVENOUS
  Filled 2020-03-07: qty 1

## 2020-03-07 MED ORDER — ACETAMINOPHEN 325 MG PO TABS
650.0000 mg | ORAL_TABLET | Freq: Once | ORAL | Status: AC
  Administered 2020-03-07: 03:00:00 650 mg via ORAL
  Filled 2020-03-07: qty 2

## 2020-03-07 NOTE — Progress Notes (Signed)
Attempted to get patient up to the bathroom twice. Patient complained of feeling dizzy and tired.  Patient said her left leg were still feeling heavy. Patient complained of incisional pain.  Patient did not have any PRN pain medications in place.  RN notified Dr. Mayford Knife of patient's decrease BP(see flow sheet), patient's symptoms, and need order for pain med.  Informed Dr. Mayford Knife patient still has foley in place due to patient not able to ambulate to the bathroom at this time.  Orders placed by Dr. Mayford Knife.

## 2020-03-07 NOTE — Progress Notes (Signed)
Due to concern for symptomatic anemia, CBC drawn, Hg, 6.8 will transfuse 2u pRBC and recheck CBC 1-2 hours after transfusion completed.

## 2020-03-07 NOTE — Progress Notes (Signed)
Notified Dr. Mayford Knife of patient's Hgb of 6.8.  Dr. Mayford Knife will put in new orders.

## 2020-03-07 NOTE — Plan of Care (Signed)
  Problem: Education: Goal: Knowledge of General Education information will improve Description: Including pain rating scale, medication(s)/side effects and non-pharmacologic comfort measures Note: Stratus interpreter [phone] and [phone] used for plan of care for today for patient and for baby. Maudry Diego Bronte    Problem: Activity: Goal: Risk for activity intolerance will decrease Note: Orthostatics obtained and assisted patient to stand at bedside after blood transfusion complete and after breakfast. Patient became slightly dizzy but was able to stand for a few minutes and then sit back down on side of bed to dangle for a few minutes. Pads changed in the bed. See flowsheets for vital signs. Raelyn Mora, CNM was in room and observed and viewed vital signs as well. Instructed by Raelyn Mora to keep lactated ringers running for now and to keep foley in place until patient progresses better and provider instructs to remove. Patient has been resting since after being given pain medication. Will continue to monitor. Earl Gala, Linda Hedges [place]

## 2020-03-07 NOTE — Progress Notes (Signed)
Obtained consent for blood transfusion from the patient using stratus interpreter [phone].  Reviewed patient education and potential allergic reactions.  Patient verbalize understanding.

## 2020-03-07 NOTE — Progress Notes (Signed)
Patient ID: Patricia Bonilla, female   DOB: [birth year], 29 y.o.   MRN: [id number] POSTPARTUM PROGRESS NOTE  POD #1  Subjective:  Patricia Bonilla is a 29 y.o. G3P3003 s/p pLTCS at [redacted]w[redacted]d.  She reports she doing well. No acute events overnight. She reports she is not doing well. She reports not feeling strong enough to ambulate or stand independently. She denies any problems with po intake. Denies nausea or vomiting. She has passed flatus. Pain is moderately controlled.  Lochia is moderate.  Baby boy breast and bottle feeding / Circumsicion: NO  Objective: Patient Vitals for the past 24 hrs:  BP Temp Temp src Pulse Resp SpO2  03/07/20 1047 -- -- -- -- -- 100 %  03/07/20 1033 -- -- -- -- -- 98 %  03/07/20 0835 (!) 81/47 98.4 F (36.9 C) Oral 73 16 98 %  03/07/20 0615 (!) 74/47 98 F (36.7 C) Oral (!) 52 18 100 %  03/07/20 0542 (!) 80/43 98.3 F (36.8 C) Oral (!) 53 18 100 %  03/07/20 0515 (!) 80/46 97.9 F (36.6 C) Axillary (!) 55 18 100 %  03/07/20 0350 (!) 80/46 98.9 F (37.2 C) Axillary 63 17 98 %  03/07/20 0304 (!) 82/43 98.8 F (37.1 C) Oral 60 18 100 %  03/07/20 0016 (!) 83/48 -- -- 65 18 98 %  03/06/20 2249 (!) 81/48 -- -- 67 18 100 %  03/06/20 2137 (!) 83/46 98.9 F (37.2 C) Oral 68 18 100 %  03/06/20 1722 -- 98.1 F (36.7 C) Oral -- 18 98 %  03/06/20 1449 (!) 80/57 98.7 F (37.1 C) Oral 62 18 96 %  03/06/20 1239 -- 98.8 F (37.1 C) Axillary 75 18 98 %     Physical Exam:  General: alert, cooperative and no distress Chest: no respiratory distress Heart:regular rate, distal pulses intact Abdomen: soft, nontender,  Uterine Fundus: firm, appropriately tender, U-1 DVT Evaluation: No calf swelling or tenderness Extremities: No edema Skin: warm, dry; incision well approximated / no erythema / no ecchymosis / no drainage, clean/dry/intact w/ honeycomb dressing in place   Recent Labs    03/07/20 0028 03/07/20 1018  HGB 6.8* 8.4*  HCT 19.9* 25.4*    Assessment/Plan: Patricia Bonilla  is a 29 y.o. G3P3003 s/p pLTCS at [redacted]w[redacted]d for AOD.  POD#1 - Doing welll; pain minimally controlled. H/H appropriate  Routine postpartum care  OOB, ambulated  Lovenox for VTE prophylaxis Anemia: symptomatic  Transfusion of 2U PRBCs complete  Contraception: Paragard IUD (done) Feeding: both  Dispo: Plan for discharge possibly day 3.   LOS: 3 days   Raelyn Mora, CNM 03/07/2020, 11:14 AM

## 2020-03-08 DIAGNOSIS — O480 Post-term pregnancy: Secondary | ICD-10-CM | POA: Diagnosis not present

## 2020-03-08 DIAGNOSIS — Z20822 Contact with and (suspected) exposure to covid-19: Secondary | ICD-10-CM | POA: Diagnosis not present

## 2020-03-08 LAB — CBC
HCT: 26.1 % — ABNORMAL LOW (ref 36.0–46.0)
Hemoglobin: 8.9 g/dL — ABNORMAL LOW (ref 12.0–15.0)
MCH: 32.1 pg (ref 26.0–34.0)
MCHC: 34.1 g/dL (ref 30.0–36.0)
MCV: 94.2 fL (ref 80.0–100.0)
Platelets: 153 10*3/uL (ref 150–400)
RBC: 2.77 MIL/uL — ABNORMAL LOW (ref 3.87–5.11)
RDW: 15.5 % (ref 11.5–15.5)
WBC: 8.2 10*3/uL (ref 4.0–10.5)
nRBC: 0 % (ref 0.0–0.2)

## 2020-03-08 LAB — TYPE AND SCREEN
ABO/RH(D): AB POS
Antibody Screen: NEGATIVE
Unit division: 0
Unit division: 0

## 2020-03-08 LAB — BASIC METABOLIC PANEL
Anion gap: 9 (ref 5–15)
BUN: 7 mg/dL (ref 6–20)
CO2: 24 mmol/L (ref 22–32)
Calcium: 8.5 mg/dL — ABNORMAL LOW (ref 8.9–10.3)
Chloride: 107 mmol/L (ref 98–111)
Creatinine, Ser: 0.5 mg/dL (ref 0.44–1.00)
GFR, Estimated: >60 mL/min (ref >60)
Glucose, Bld: 76 mg/dL (ref 70–99)
Potassium: 4.2 mmol/L (ref 3.5–5.1)
Sodium: 140 mmol/L (ref 135–145)

## 2020-03-08 LAB — BPAM RBC
Blood Product Expiration Date: 202201202359
Blood Product Expiration Date: 202201212359
ISSUE DATE / TIME: 202112240317
ISSUE DATE / TIME: 202112240536
Unit Type and Rh: 8400
Unit Type and Rh: 8400

## 2020-03-08 LAB — TROPONIN I (HIGH SENSITIVITY): Troponin I (High Sensitivity): 3 ng/L (ref <18)

## 2020-03-08 MED ORDER — VITAMIN C 250 MG PO TABS
250.0000 mg | ORAL_TABLET | ORAL | Status: DC
  Administered 2020-03-08: 12:00:00 250 mg via ORAL
  Filled 2020-03-08: qty 1

## 2020-03-08 MED ORDER — FERROUS SULFATE 325 (65 FE) MG PO TABS
325.0000 mg | ORAL_TABLET | ORAL | Status: DC
  Administered 2020-03-08: 12:00:00 325 mg via ORAL
  Filled 2020-03-08: qty 1

## 2020-03-08 NOTE — Progress Notes (Signed)
POSTPARTUM PROGRESS NOTE  Subjective: Patricia Bonilla is a 29 y.o. G3P3003 s/p LTCS at [redacted]w[redacted]d. She reports she doing well. No acute events overnight. She denies any problems with ambulating, voiding or po intake. Denies nausea or vomiting. She has passed flatus. Pain is moderately controlled.  Lochia is minimal. Pt reports new chest pain described as substernal pressure, now improved s/p oxycodone. Pt also endorsing slight shortness of breath.  Objective: Blood pressure (!) 85/60, pulse (!) 58, temperature 98.5 F (36.9 C), temperature source Oral, resp. rate 17, height 5\' 2"  (1.575 m), weight 72.2 kg, SpO2 99 %, unknown if currently breastfeeding.  Physical Exam:  General: alert, cooperative and no distress. Tired appearing. Chest: no respiratory distress. Bradycardic, regular rhythm, 3/6 flow murmur along left lower sternal border, non-tender to palpation Abdomen: soft, minimal tenderness to palpation. Honeycomb dressing moderately saturated with dry blood. Uterine Fundus: firm and at level of umbilicus Extremities: No calf swelling or tenderness  no LE edema  Recent Labs    03/07/20 1018 03/08/20 0741  HGB 8.4* 8.9*  HCT 25.4* 26.1*    Assessment/Plan: Patricia Bonilla is a 29 y.o. 38 s/p vaginal delivery at [redacted]w[redacted]d for .  Routine Postpartum Care: Doing well, pain well-controlled.  -- Continue routine care, lactation support  -- Contraception: IUD paraguard placed -- Feeding: breast and bottle -- Chest Pain: Will f/u CXR, EKG and labs to r/o ACS and volume overload. Low suspicion of PE given no signs/symptoms of DVT. Symptoms most likely related to anemia but will continue to monitor and emphasized with pt importance of prompt notification to nursing if concern of recurrent symptoms. -- Anemia: nadir of Hgb 6.7, appropriately improved s/p 2u pRBCs. F/u repeat CBC today.  Dispo: Plan for discharge POD#3-4.  [redacted]w[redacted]d, MD OB Fellow, [facility] 03/08/2020 8:56 AM

## 2020-03-08 NOTE — Progress Notes (Signed)
This RN and night shift RN Mickie Bail went in for change of shift. Pt complaining of sudden onset dizziness. Per pt and FOB, pt only had some water and about half of a large cup from [employer] (30 oz cup- half of this) of cranberry juice. RN explained that most likely pt is dehydrated and needs to drink a lot of water. RN also noticed the other void amount from pt is , fairly dark urine. VS WNL. RN notified MD on call with no new orders received. Night shift nurse there for report and taking over care of patient.

## 2020-03-08 NOTE — Progress Notes (Signed)
During shift assessment, patients leg sensation was assessed. The patient stated that her right leg felt normal, but her left leg was numb and had been numb since her C-section on 12/23. She also stated that her mid to lower back was hurting. In house anesthesiologist was notified at 2300 of this. Anesthesiologist stated that she would come and assess the patient. Will further document the patients progress and concerns.

## 2020-03-08 NOTE — Anesthesia Postprocedure Evaluation (Signed)
Anesthesia Post Note  Patient: Patricia Bonilla  Procedure(s) Performed: CESAREAN SECTION INTRAUTERINE DEVICE (IUD) INSERTION (N/A )     Patient location during evaluation: [facility] Anesthesia Type: Epidural Level of consciousness: awake and alert and oriented Pain management: pain level controlled Vital Signs Assessment: post-procedure vital signs reviewed and stable Respiratory status: spontaneous breathing and nonlabored ventilation Cardiovascular status: stable Postop Assessment: no headache, no backache, patient able to bend at knees, no signs of nausea or vomiting, adequate PO intake and able to ambulate Anesthetic complications: no   No complications documented.  Last Vitals:  Vitals:   03/08/20 0605 03/08/20 0918  BP: (!) 85/60 91/61  Pulse: (!) 58 (!) 52  Resp: 17 16  Temp: 36.9 C 36.8 C  SpO2: 99% 100%    Last Pain:  Vitals:   03/08/20 0918  TempSrc: Oral  PainSc: 4    Pain Goal: Patients Stated Pain Goal: 3 (03/07/20 1040)              Epidural/Spinal Function Cutaneous sensation: Normal sensation (03/08/20 0918), Patient able to flex knees: Yes (03/08/20 1610), Patient able to lift hips off bed: Yes (03/08/20 0918), Back pain beyond tenderness at insertion site: No (03/08/20 0918), Progressively worsening motor and/or sensory loss: No (03/08/20 0918), Bowel and/or bladder incontinence post epidural: No (03/08/20 9604)  Madison Hickman

## 2020-03-09 ENCOUNTER — Inpatient Hospital Stay (HOSPITAL_COMMUNITY): Payer: Medicaid Other

## 2020-03-09 ENCOUNTER — Inpatient Hospital Stay (HOSPITAL_COMMUNITY)
Admission: AD | Admit: 2020-03-09 | Payer: Medicaid Other | Source: Home / Self Care | Admitting: Obstetrics and Gynecology

## 2020-03-09 MED ORDER — IBUPROFEN 800 MG PO TABS: 800.0000 mg | ORAL_TABLET | Freq: Three times a day (TID) | ORAL | 0 refills | Status: DC | PRN

## 2020-03-09 MED ORDER — OXYCODONE HCL 5 MG PO TABS: 5.0000 mg | ORAL_TABLET | Freq: Four times a day (QID) | ORAL | 0 refills | Status: AC | PRN

## 2020-03-09 MED ORDER — ACETAMINOPHEN 325 MG PO TABS: 650.0000 mg | ORAL_TABLET | Freq: Four times a day (QID) | ORAL | Status: AC

## 2020-03-09 MED ORDER — FERROUS SULFATE 325 (65 FE) MG PO TABS: 325.0000 mg | ORAL_TABLET | ORAL | 3 refills | Status: AC

## 2020-03-09 MED ORDER — ASCORBIC ACID 250 MG PO TABS: 250.0000 mg | ORAL_TABLET | ORAL | Status: AC

## 2020-03-09 MED ORDER — COCONUT OIL OIL: 1.0000 "application " | TOPICAL_OIL | 0 refills | Status: AC | PRN

## 2020-03-09 NOTE — Anesthesia Postprocedure Evaluation (Signed)
Anesthesia Post Note   S: Ms. Patricia Bonilla was seen in [facility] at request of her RN for left leg weakness and decreased sensation. Patient interview was conducted with [place] iPad interpreter with some difficulty due to language barrier. Patient states that since her cesarean section on 12/23 her left leg has felt "heavy" and feels "asleep". She also has midline lumbar back soreness which has persisted. She has required her husband's assistance to get out of bed for the past 2 days but has been using toilet independently and has not required assistive devices. I note that none of these issues have been charted in her flowsheets, however patient and her husband state that this weakness and sensory change has been present since her delivery. She had an uneventful epidural charted which was used for her surgical anesthesia. She denies progressive symptoms, bowel or bladder incontinence, fever, chills. Overall, she states her left leg motor and sensory changes as well as her back pain have improved today compared to yesterday.  O:  Gen: Appears uncomfortable with any movement in bed, clutching abdomen/surgical site.  Neuro: Awake and alert. Able to flex at hips, lift knees off bed, dorsiflex/plantarflex feet but somewhat decreased on left-improved with verbal encouragement. Subjective decreased sensation throughout left leg with no discernable dermatomal distribution. Able to move both legs to sit at side of bed independently. Able to stand with hand assist due to stated discomfort. Normal gait but overall guarded due to discomfort. Able to use toilet independently. Skin: No abnormalities, bruising, or hematoma noted to epidural site.  A/P: #Postpartum back pain and question of left leg sensory/motor changes Low suspicion for any nefarious causes of perceived symptoms such as epidural hematoma or infection. Overall, her symptoms are improving per her report. On exam, she has normal gait with encouragement.  I strongly recommended to her to ambulate and get out of bed as she has had limited mobility due to incisional pain. I instructed her and her husband as well as her RN to contact the anesthesiologist on call for any concerns or worsening symptoms. All questions were answered to patient and her family's satisfaction.        Rollene Rotunda, MD, MPH Anesthesiology

## 2020-03-09 NOTE — Discharge Instructions (Signed)
-take tylenol 1000 mg every 6 hours as needed alternate with ibuprofen 800 mg every 6 hours as needed for pain -if this isn't working take oxycodone for breakthrough pain -drink plenty of water, especially while breastfeeding -take iron (ferrous sulfate) and vitamin c every other day -take c section dressing off on Thursday Postpartum Care After Cesarean Delivery This sheet gives you information about how to care for yourself from the time you deliver your baby to up to 6-12 weeks after delivery (postpartum period). Your health care provider may also give you more specific instructions. If you have problems or questions, contact your health care provider. Follow these instructions at home: Medicines  Take over-the-counter and prescription medicines only as told by your health care provider.  If you were prescribed an antibiotic medicine, take it as told by your health care provider. Do not stop taking the antibiotic even if you start to feel better.  Ask your health care provider if the medicine prescribed to you: ? Requires you to avoid driving or using heavy machinery. ? Can cause constipation. You may need to take actions to prevent or treat constipation, such as:  Drink enough fluid to keep your urine pale yellow.  Take over-the-counter or prescription medicines.  Eat foods that are high in fiber, such as beans, whole grains, and fresh fruits and vegetables.  Limit foods that are high in fat and processed sugars, such as fried or sweet foods. Activity  Gradually return to your normal activities as told by your health care provider.  Avoid activities that take a lot of effort and energy (are strenuous) until approved by your health care provider. Walking at a slow to moderate pace is usually safe. Ask your health care provider what activities are safe for you. ? Do not lift anything that is heavier than your baby or 10 lb (4.5 kg) as told by your health care provider. ? Do not  vacuum, climb stairs, or drive a car for as long as told by your health care provider.  If possible, have someone help you at home until you are able to do your usual activities yourself.  Rest as much as possible. Try to rest or take naps while your baby is sleeping. Vaginal bleeding  It is normal to have vaginal bleeding (lochia) after delivery. Wear a sanitary pad to absorb vaginal bleeding and discharge. ? During the first week after delivery, the amount and appearance of lochia is often similar to a menstrual period. ? Over the next few weeks, it will gradually decrease to a dry, yellow-brown discharge. ? For most women, lochia stops completely by 4-6 weeks after delivery. Vaginal bleeding can vary from woman to woman.  Change your sanitary pads frequently. Watch for any changes in your flow, such as: ? A sudden increase in volume. ? A change in color. ? Large blood clots.  If you pass a blood clot, save it and call your health care provider to discuss. Do not flush blood clots down the toilet before you get instructions from your health care provider.  Do not use tampons or douches until your health care provider says this is safe.  If you are not breastfeeding, your period should return 6-8 weeks after delivery. If you are breastfeeding, your period may return anytime between 8 weeks after delivery and the time that you stop breastfeeding. Perineal care   If your C-section (Cesarean section) was unplanned, and you were allowed to labor and push before delivery, you may  have pain, swelling, and discomfort of the tissue between your vaginal opening and your anus (perineum). You may also have an incision in the tissue (episiotomy) or the tissue may have torn during delivery. Follow these instructions as told by your health care provider: ? Keep your perineum clean and dry as told by your health care provider. Use medicated pads and pain-relieving sprays and creams as directed. ? If you  have an episiotomy or vaginal tear, check the area every day for signs of infection. Check for:  Redness, swelling, or pain.  Fluid or blood.  Warmth.  Pus or a bad smell. ? You may be given a squirt bottle to use instead of wiping to clean the perineum area after you go to the bathroom. As you start healing, you may use the squirt bottle before wiping yourself. Make sure to wipe gently. ? To relieve pain caused by an episiotomy, vaginal tear, or hemorrhoids, try taking a warm sitz bath 2-3 times a day. A sitz bath is a warm water bath that is taken while you are sitting down. The water should only come up to your hips and should cover your buttocks. Breast care  Within the first few days after delivery, your breasts may feel heavy, full, and uncomfortable (breast engorgement). You may also have milk leaking from your breasts. Your health care provider can suggest ways to help relieve breast discomfort. Breast engorgement should go away within a few days.  If you are breastfeeding: ? Wear a bra that supports your breasts and fits you well. ? Keep your nipples clean and dry. Apply creams and ointments as told by your health care provider. ? You may need to use breast pads to absorb milk leakage. ? You may have uterine contractions every time you breastfeed for several weeks after delivery. Uterine contractions help your uterus return to its normal size. ? If you have any problems with breastfeeding, work with your health care provider or a Advertising copywriter.  If you are not breastfeeding: ? Avoid touching your breasts as this can make your breasts produce more milk. ? Wear a well-fitting bra and use cold packs to help with swelling. ? Do not squeeze out (express) milk. This causes you to make more milk. Intimacy and sexuality  Ask your health care provider when you can engage in sexual activity. This may depend on your: ? Risk of infection. ? Healing rate. ? Comfort and desire to  engage in sexual activity.  You are able to get pregnant after delivery, even if you have not had your period. If desired, talk with your health care provider about methods of family planning or birth control (contraception). Lifestyle  Do not use any products that contain nicotine or tobacco, such as cigarettes, e-cigarettes, and chewing tobacco. If you need help quitting, ask your health care provider.  Do not drink alcohol, especially if you are breastfeeding. Eating and drinking   Drink enough fluid to keep your urine pale yellow.  Eat high-fiber foods every day. These may help prevent or relieve constipation. High-fiber foods include: ? Whole grain cereals and breads. ? Brown rice. ? Beans. ? Fresh fruits and vegetables.  Take your prenatal vitamins until your postpartum checkup or until your health care provider tells you it is okay to stop. General instructions  Keep all follow-up visits for you and your baby as told by your health care provider. Most women visit their health care provider for a postpartum checkup within the first 3-6  weeks after delivery. Contact a health care provider if you:  Feel unable to cope with the changes that a new baby brings to your life, and these feelings do not go away.  Feel unusually sad or worried.  Have breasts that are painful, hard, or turn red.  Have a fever.  Have trouble holding urine or keeping urine from leaking.  Have little or no interest in activities you used to enjoy.  Have not breastfed at all and you have not had a menstrual period for 12 weeks after delivery.  Have stopped breastfeeding and you have not had a menstrual period for 12 weeks after you stopped breastfeeding.  Have questions about caring for yourself or your baby.  Pass a blood clot from your vagina. Get help right away if you:  Have chest pain.  Have difficulty breathing.  Have sudden, severe leg pain.  Have severe pain or cramping in your  abdomen.  Bleed from your vagina so much that you fill more than one sanitary pad in one hour. Bleeding should not be heavier than your heaviest period.  Develop a severe headache.  Faint.  Have blurred vision or spots in your vision.  Have a bad-smelling vaginal discharge.  Have thoughts about hurting yourself or your baby. If you ever feel like you may hurt yourself or others, or have thoughts about taking your own life, get help right away. You can go to your nearest emergency department or call:  Your local emergency services (911 in the [place]  A suicide crisis helpline, such as the National Suicide Prevention Lifeline at (870)272-8907. This is open 24 hours a day. Summary  The period of time from when you deliver your baby to up to 6-12 weeks after delivery is called the postpartum period.  Gradually return to your normal activities as told by your health care provider.  Keep all follow-up visits for you and your baby as told by your health care provider. This information is not intended to replace advice given to you by your health care provider. Make sure you discuss any questions you have with your health care provider. Document Revised: 10/19/2017 Document Reviewed: 10/19/2017 Elsevier Patient Education  2020 [employer].

## 2020-03-10 ENCOUNTER — Encounter (HOSPITAL_COMMUNITY): Payer: Self-pay | Admitting: Family Medicine

## 2020-03-18 ENCOUNTER — Ambulatory Visit (INDEPENDENT_AMBULATORY_CARE_PROVIDER_SITE_OTHER): Payer: Medicaid Other | Admitting: Nurse Practitioner

## 2020-03-18 ENCOUNTER — Encounter: Payer: Self-pay | Admitting: Nurse Practitioner

## 2020-03-18 ENCOUNTER — Other Ambulatory Visit: Payer: Self-pay

## 2020-03-18 VITALS — BP 91/54 | HR 58 | Wt 138.0 lb

## 2020-03-18 DIAGNOSIS — Z789 Other specified health status: Secondary | ICD-10-CM | POA: Diagnosis not present

## 2020-03-18 NOTE — Progress Notes (Signed)
Cc: incision check  Patricia Bonilla 30 y.o.Postpartum for one week after C/S birth.  Interpreter present for the entire visit  S:  Doing well.  No complaints.   O: GENERAL: Well-developed, well-nourished female in no acute distress.  HEENT: Normocephalic, atraumatic.  LUNGS: Effort normal  HEART: Regular rate  SKIN: Warm, dry and without erythema, C/S incision clean, dry, no drainage,  Fully closed, appropriately tender, no erythema  PSYCH: Normal mood and affect  A:  Incision well healed  Plan:Return in 3 weeks for postpartum visit with IUD string check.  Nolene Bernheim, RN, MSN, NP-BC Nurse Practitioner, [facility] for [employer], [facility] 03/18/2020 11:47 AM

## 2020-03-18 NOTE — Progress Notes (Signed)
Telephone interpreter [phone] used for first part of visit and then in person interpreter showed for visit.  Patient presents for incision check. Armandina Stammer RN

## 2020-03-25 MED FILL — Copper IUD: INTRAUTERINE | Qty: 1 | Status: AC

## 2020-04-08 ENCOUNTER — Other Ambulatory Visit: Payer: Self-pay

## 2020-04-08 ENCOUNTER — Ambulatory Visit (INDEPENDENT_AMBULATORY_CARE_PROVIDER_SITE_OTHER): Payer: Medicaid Other | Admitting: Advanced Practice Midwife

## 2020-04-08 ENCOUNTER — Encounter: Payer: Self-pay | Admitting: Advanced Practice Midwife

## 2020-04-08 VITALS — BP 98/66 | HR 67 | Wt 133.0 lb

## 2020-04-08 DIAGNOSIS — Z98891 History of uterine scar from previous surgery: Secondary | ICD-10-CM

## 2020-04-08 MED ORDER — IBUPROFEN 800 MG PO TABS: 800.0000 mg | ORAL_TABLET | Freq: Three times a day (TID) | ORAL | 0 refills | Status: AC | PRN

## 2020-04-08 NOTE — Patient Instructions (Signed)

## 2020-04-08 NOTE — Progress Notes (Signed)
    Post Partum Visit Note  Patricia Bonilla is a 30 y.o. G51P3003 female who presents for a postpartum visit. She is 4 weeks postpartum following a primary cesarean section.  I have fully reviewed the prenatal and intrapartum course. The delivery was at 40  gestational weeks.  Anesthesia: epidural. Postpartum course has been uneventful.  Had some sharp "poking" sensations first 2 wks but less now.  Were in hip and suprapubic.  No intercourse yet.  Patricia Bonilla is doing well. Baby is feeding by  Bottle [employer] Good start . Bleeding no bleeding. Bowel function is normal. Bladder function is normal. Patient is not sexually active. Contraception method is IUD. Postpartum depression screening: negative.   The pregnancy intention screening data noted above was reviewed. Potential methods of contraception were discussed. The patient elected to proceed with IUD or IUS.      The following portions of the patient's history were reviewed and updated as appropriate: allergies, current medications, past family history, past medical history, past social history, past surgical history and problem list.  Review of Systems Pertinent items noted in HPI and remainder of comprehensive ROS otherwise negative.    Objective:  LMP  (LMP Unknown)    General:  alert, cooperative and no distress   Breasts:  inspection negative, no nipple discharge or bleeding, no masses or nodularity palpable  Lungs: clear to auscultation bilaterally  Heart:  regular rate and rhythm, S1, S2 normal, no murmur, click, rub or gallop  Abdomen: soft, non-tender; bowel sounds normal; no masses,  no organomegaly   Vulva:  normal  Vagina: normal vagina  Cervix:  multiparous appearance and Paragard IUD string visible (white)  Corpus: normal size, contour, position, consistency, mobility, non-tender  Adnexa:  normal adnexa and no mass, fullness, tenderness  Rectal Exam: Not performed.        Assessment:    Normal  postpartum exam.  IUD string  visible Pap smear not done at today's visit.   Plan:   Essential components of care per ACOG recommendations:  1.  Mood and well being: Patient with negative depression screening today. Reviewed local resources for support.  - Patient does not use tobacco.  - hx of drug use? No   If yes, discussed support systems  2. Infant care and feeding:  -Patient currently breastmilk feeding? No  -Social determinants of health (SDOH) reviewed in EPIC. No concerns 3. Sexuality, contraception and birth spacing - Patient does not want a pregnancy in the next year.  Desired family size is not discussed   - Reviewed forms of contraception in tiered fashion. Patient had IUD placed during C/S - Discussed birth spacing of 18 months  4. Sleep and fatigue -Encouraged family/partner/community support of 4 hrs of uninterrupted sleep to help with mood and fatigue  5. Physical Recovery  - Discussed patients delivery  and complications - Patient has urinary incontinence? No  - Patient is safe to resume physical and sexual activity  6.  Health Maintenance - Last pap smear done 11/06/19 and was normal with negative HPV.   7. NoChronic Disease - PCP follow up  chiquita l wilson, CMA [facility] [employer], [facility]

## 2021-11-06 IMAGING — US US MFM OB DETAIL+14 WK
1 series · 13 of 28 positions shown · non-contrast
Comparison: none

[Series 1: us mfm ob detail+14 wk · 13 of 95 slices shown]
[im 4/95]
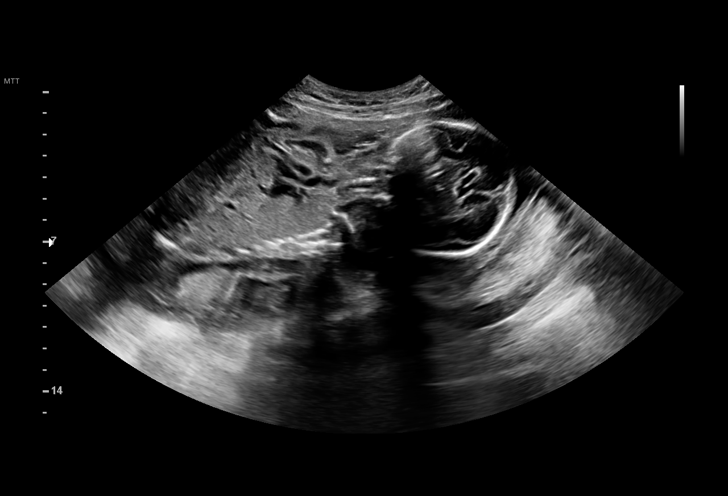
[im 11/95]
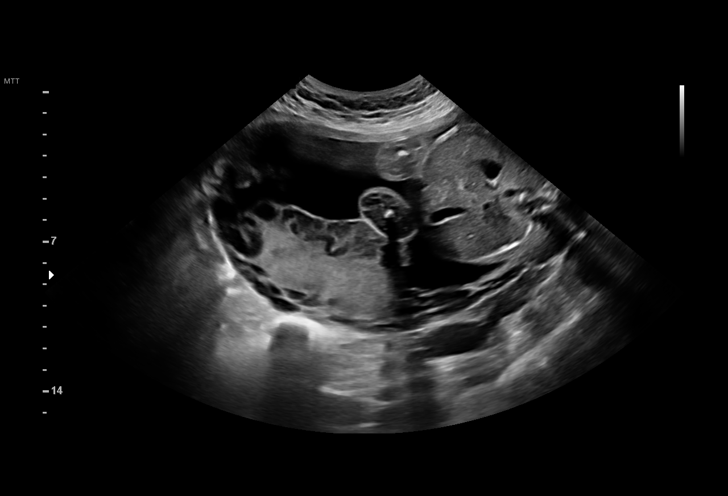
[im 18/95]
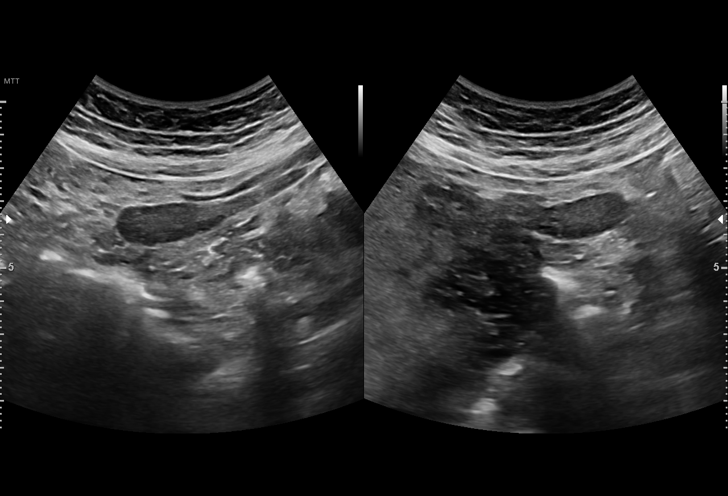
[im 25/95]
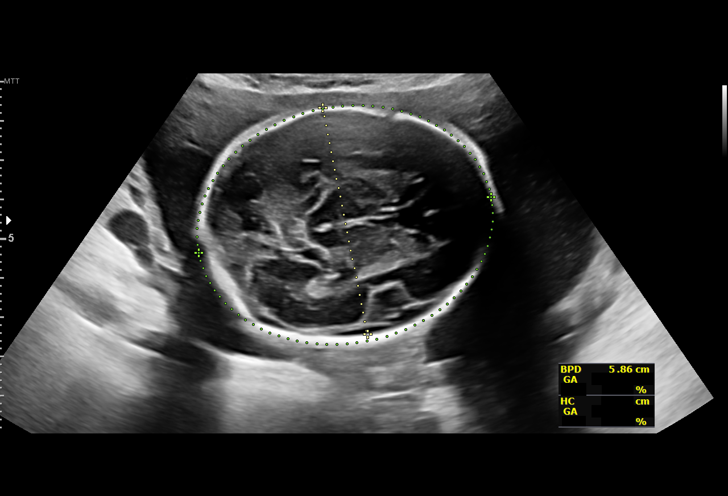
[im 32/95]
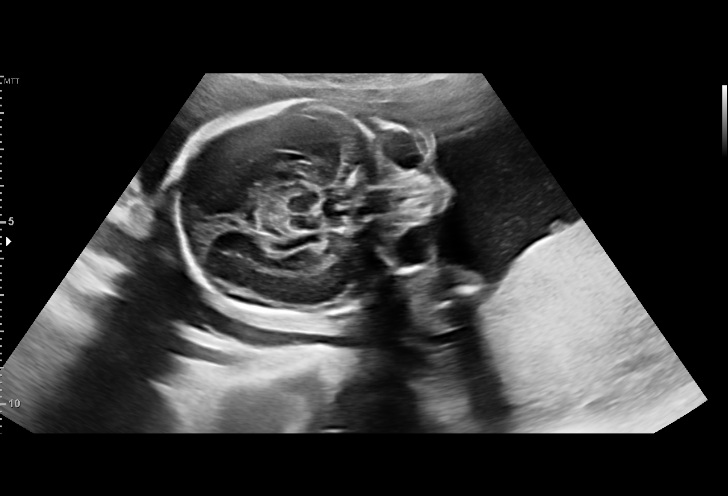
[im 39/95]
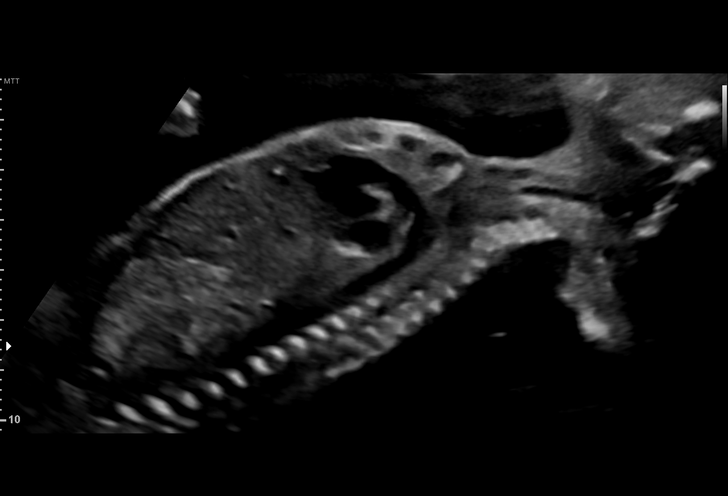
[im 49/95]
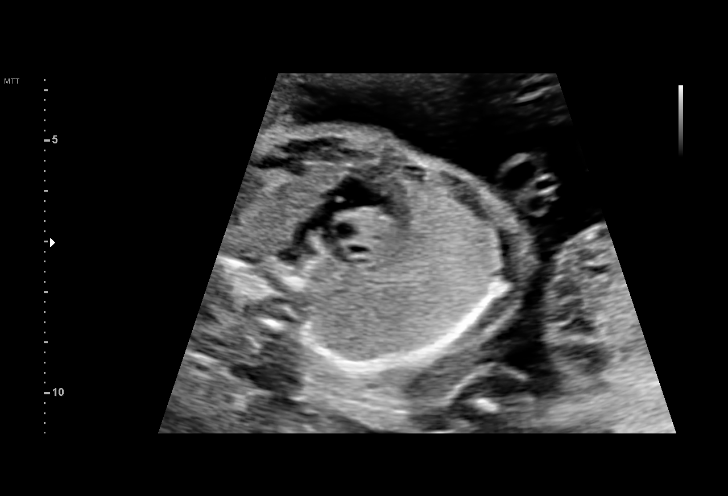
[im 56/95]
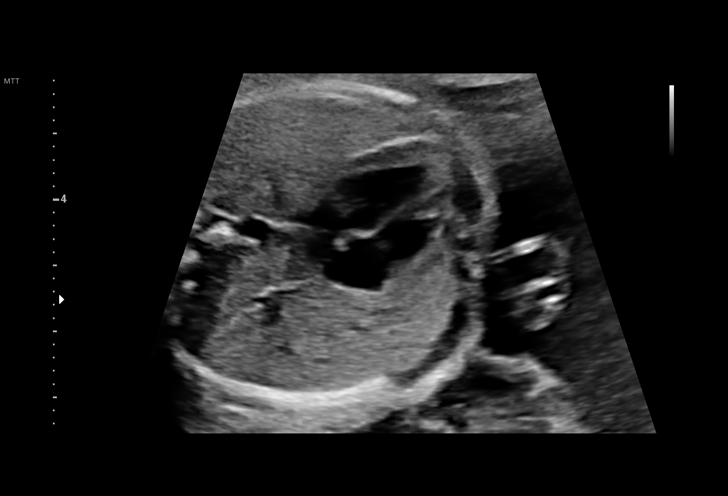
[im 63/95]
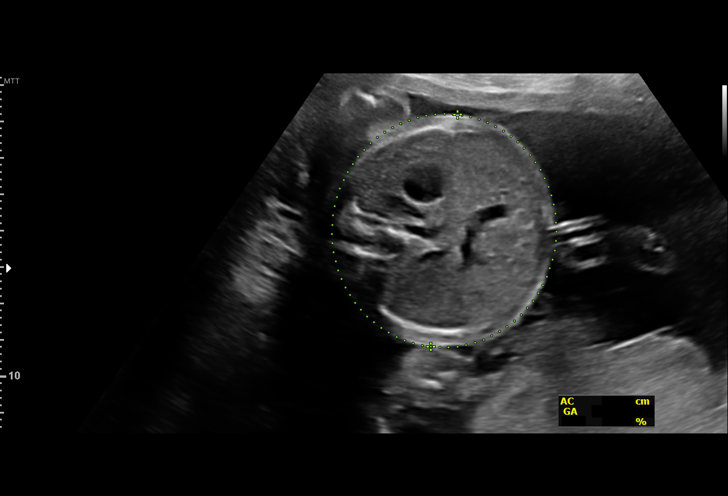
[im 70/95]
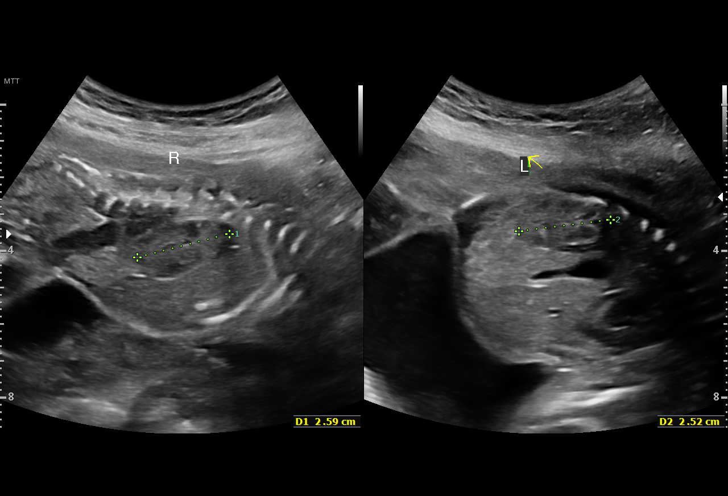
[im 77/95]
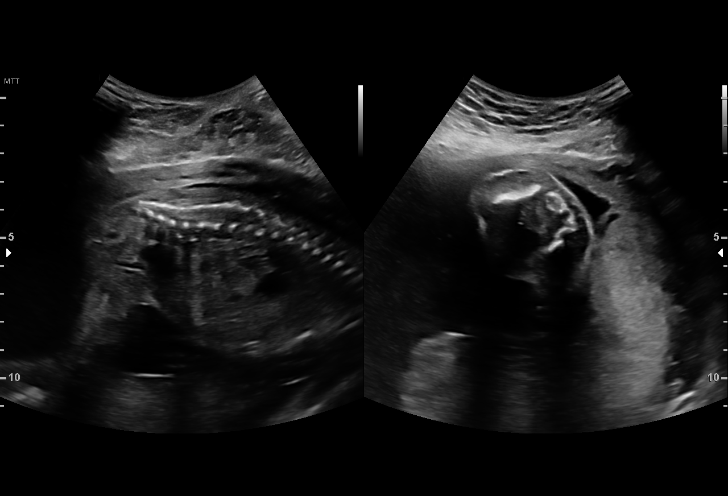
[im 84/95]
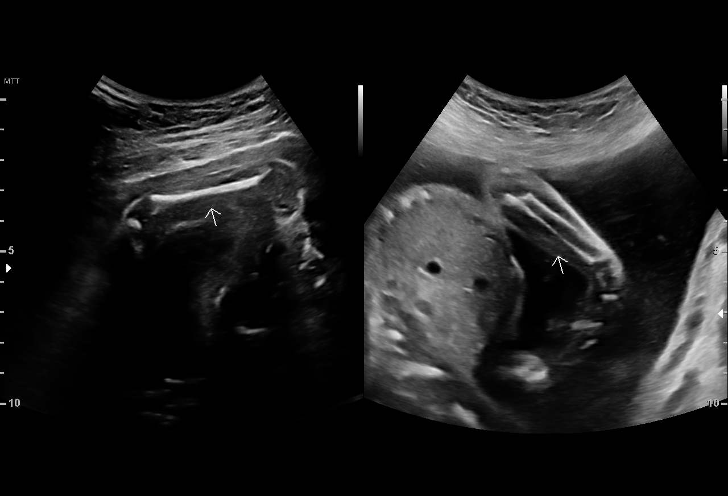
[im 91/95]
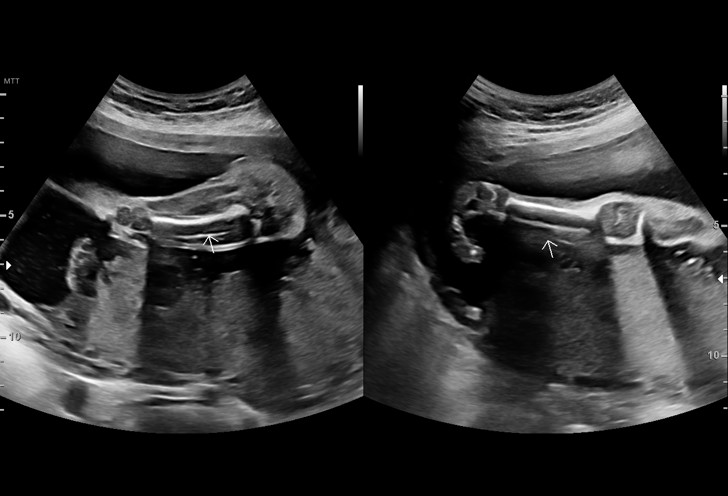

[13 of 28 positions shown; findings below may reference images not displayed]

Ref. Address:     [[id number]]
                   [id number]

Indications

 23 weeks gestation of pregnancy
 Late prenatal care, second trimester
 Encounter for uncertain dates
 Medical complication of pregnancy
 (sensorineural hearing loss)
Fetal Evaluation

 Num Of Fetuses:         1
 Fetal Heart Rate(bpm):  148
 Cardiac Activity:       Observed
 Presentation:           Cephalic
 Placenta:               Posterior
 P. Cord Insertion:      Visualized, central

 Amniotic Fluid
 AFI FV:      Within normal limits

                             Largest Pocket(cm)

Biometry

 BPD:      59.1  mm     G. Age:  24w 1d         61  %    CI:        75.43   %    70 - 86
                                                         FL/HC:      18.4   %    18.7 -
 HC:      215.8  mm     G. Age:  23w 4d         30  %    HC/AC:      1.09        1.05 -
 AC:      197.4  mm     G. Age:  24w 3d         64  %    FL/BPD:     67.2   %    71 - 87
 FL:       39.7  mm     G. Age:  22w 6d         13  %    FL/AC:      20.1   %    20 - 24
 HUM:      36.2  mm     G. Age:  22w 5d         16  %
 CER:      25.4  mm     G. Age:  23w 1d         49  %

 LV:        4.3  mm
 CM:        6.2  mm

 Est. FW:     622  gm      1 lb 6 oz     42  %
OB History

 Gravidity:    3         Term:   2        Prem:   0        SAB:   0
 TOP:          0       Ectopic:  0        Living: 2
Gestational Age

 U/S Today:     23w 5d                                        EDD:   03/01/20
 Best:          23w 5d     Det. By:  U/S (11/08/19)           EDD:   03/01/20
Anatomy

 Cranium:               Appears normal         Aortic Arch:            Appears normal
 Cavum:                 Appears normal         Ductal Arch:            Appears normal
 Ventricles:            Appears normal         Diaphragm:              Appears normal
 Choroid Plexus:        Appears normal         Stomach:                Appears normal, left
                                                                       sided
 Cerebellum:            Appears normal         Abdomen:                Appears normal
 Posterior Fossa:       Appears normal         Abdominal Wall:         Appears nml (cord
                                                                       insert, abd wall)
 Nuchal Fold:           Appears normal         Cord Vessels:           Appears normal (3
                                                                       vessel cord)
 Face:                  Appears normal         Kidneys:                Appear normal
                        (orbits and profile)
 Lips:                  Appears normal         Bladder:                Appears normal
 Thoracic:              Appears normal         Spine:                  Appears normal
 Heart:                 Appears normal         Upper Extremities:      Appears normal
                        (4CH, axis, and
                        situs)
 RVOT:                  Appears normal         Lower Extremities:      Appears normal
 LVOT:                  Appears normal

 Other:  Fetus appears to be a male. Heels visualized. Nasal bone visualized.
         hands suboptimally seen
Cervix Uterus Adnexa

 Cervix
 Length:           3.71  cm.
 Normal appearance by transabdominal scan.

 Uterus
 No abnormality visualized.

 Right Ovary
 Within normal limits. No adnexal mass visualized.

 Left Ovary
 Within normal limits. No adnexal mass visualized.
 Cul De Sac
 No free fluid seen.

 Adnexa
 No abnormality visualized.
Impression

 Single intrauterine pregnancy with measurements
 inconsistent with dates.
 Normal anatomy with good amniotic fluid and fetal movement
 We have adjusted dating to today's dates of 03/01/20

Recommendations

 Follow up anatomy in 6 weeks to confirm dating.
